# Patient Record
Sex: Female | Born: 1955 | Race: White | Hispanic: No | Smoking: Never smoker
Health system: Southern US, Community
[De-identification: ages and names within clinical notes are randomized; demographics above are authoritative.]

## PROBLEM LIST (undated history)

## (undated) DIAGNOSIS — G43809 Other migraine, not intractable, without status migrainosus: Secondary | ICD-10-CM

## (undated) DIAGNOSIS — H269 Unspecified cataract: Secondary | ICD-10-CM

## (undated) DIAGNOSIS — L409 Psoriasis, unspecified: Secondary | ICD-10-CM

## (undated) HISTORY — DX: Other migraine, not intractable, without status migrainosus: G43.809

## (undated) HISTORY — DX: Unspecified cataract: H26.9

## (undated) HISTORY — DX: Psoriasis, unspecified: L40.9

## (undated) HISTORY — PX: TONSILLECTOMY: SUR1361

---

## 2001-02-22 ENCOUNTER — Ambulatory Visit (HOSPITAL_COMMUNITY): Admission: RE | Admit: 2001-02-22 | Discharge: 2001-02-22 | Payer: Self-pay | Admitting: Family Medicine

## 2001-02-22 ENCOUNTER — Encounter: Payer: Self-pay | Admitting: Family Medicine

## 2002-09-23 ENCOUNTER — Other Ambulatory Visit: Admission: RE | Admit: 2002-09-23 | Discharge: 2002-09-23 | Payer: Self-pay | Admitting: Gynecology

## 2002-10-18 ENCOUNTER — Ambulatory Visit (HOSPITAL_COMMUNITY): Admission: RE | Admit: 2002-10-18 | Discharge: 2002-10-18 | Payer: Self-pay | Admitting: Gynecology

## 2002-10-18 ENCOUNTER — Encounter: Payer: Self-pay | Admitting: Gynecology

## 2005-08-26 ENCOUNTER — Ambulatory Visit (HOSPITAL_COMMUNITY): Admission: RE | Admit: 2005-08-26 | Discharge: 2005-08-26 | Payer: Self-pay | Admitting: Gynecology

## 2005-08-29 ENCOUNTER — Ambulatory Visit: Payer: Self-pay | Admitting: Internal Medicine

## 2005-09-12 ENCOUNTER — Other Ambulatory Visit: Admission: RE | Admit: 2005-09-12 | Discharge: 2005-09-12 | Payer: Self-pay | Admitting: Gynecology

## 2005-12-01 ENCOUNTER — Ambulatory Visit: Payer: Self-pay | Admitting: Internal Medicine

## 2006-05-23 ENCOUNTER — Ambulatory Visit: Payer: Self-pay | Admitting: Internal Medicine

## 2006-05-23 LAB — CONVERTED CEMR LAB
BUN: 12 mg/dL (ref 6–23)
Basophils Absolute: 0 10*3/uL (ref 0.0–0.1)
Calcium: 9.2 mg/dL (ref 8.4–10.5)
Eosinophils Absolute: 0.1 10*3/uL (ref 0.0–0.6)
GFR calc Af Amer: 114 mL/min
GFR calc non Af Amer: 95 mL/min
Hemoglobin: 12.4 g/dL (ref 12.0–15.0)
Iron: 33 ug/dL — ABNORMAL LOW (ref 42–145)
Lymphocytes Relative: 24.4 % (ref 12.0–46.0)
MCHC: 33.3 g/dL (ref 30.0–36.0)
MCV: 73.9 fL — ABNORMAL LOW (ref 78.0–100.0)
Monocytes Absolute: 0.6 10*3/uL (ref 0.2–0.7)
Monocytes Relative: 10.7 % (ref 3.0–11.0)
Neutro Abs: 3.2 10*3/uL (ref 1.4–7.7)
Platelets: 216 10*3/uL (ref 150–400)
Potassium: 4.3 meq/L (ref 3.5–5.1)

## 2006-08-28 ENCOUNTER — Ambulatory Visit (HOSPITAL_COMMUNITY): Admission: RE | Admit: 2006-08-28 | Discharge: 2006-08-28 | Payer: Self-pay | Admitting: Gynecology

## 2006-08-29 ENCOUNTER — Ambulatory Visit: Payer: Self-pay | Admitting: Internal Medicine

## 2006-08-30 ENCOUNTER — Ambulatory Visit: Payer: Self-pay | Admitting: Internal Medicine

## 2006-08-30 LAB — CONVERTED CEMR LAB
AST: 17 units/L (ref 0–37)
Albumin: 3.6 g/dL (ref 3.5–5.2)
Bilirubin, Direct: 0.2 mg/dL (ref 0.0–0.3)
Chloride: 107 meq/L (ref 96–112)
Creatinine, Ser: 0.8 mg/dL (ref 0.4–1.2)
Glucose, Bld: 95 mg/dL (ref 70–99)
Nitrite: NEGATIVE
Potassium: 3.5 meq/L (ref 3.5–5.1)
Sodium: 142 meq/L (ref 135–145)
Specific Gravity, Urine: 1.025 (ref 1.000–1.03)
Total Bilirubin: 1.6 mg/dL — ABNORMAL HIGH (ref 0.3–1.2)
Urine Glucose: NEGATIVE mg/dL
Urobilinogen, UA: 0.2 (ref 0.0–1.0)
pH: 6 (ref 5.0–8.0)

## 2006-09-18 ENCOUNTER — Other Ambulatory Visit: Admission: RE | Admit: 2006-09-18 | Discharge: 2006-09-18 | Payer: Self-pay | Admitting: Gynecology

## 2006-11-22 ENCOUNTER — Ambulatory Visit: Payer: Self-pay | Admitting: Internal Medicine

## 2006-11-22 DIAGNOSIS — G43809 Other migraine, not intractable, without status migrainosus: Secondary | ICD-10-CM | POA: Insufficient documentation

## 2006-11-23 ENCOUNTER — Encounter: Payer: Self-pay | Admitting: Internal Medicine

## 2006-11-23 DIAGNOSIS — F32A Depression, unspecified: Secondary | ICD-10-CM | POA: Insufficient documentation

## 2006-11-23 DIAGNOSIS — F329 Major depressive disorder, single episode, unspecified: Secondary | ICD-10-CM

## 2006-11-23 DIAGNOSIS — K219 Gastro-esophageal reflux disease without esophagitis: Secondary | ICD-10-CM | POA: Insufficient documentation

## 2006-11-23 DIAGNOSIS — M1990 Unspecified osteoarthritis, unspecified site: Secondary | ICD-10-CM | POA: Insufficient documentation

## 2007-07-04 ENCOUNTER — Ambulatory Visit: Payer: Self-pay | Admitting: Internal Medicine

## 2007-07-04 DIAGNOSIS — B029 Zoster without complications: Secondary | ICD-10-CM | POA: Insufficient documentation

## 2007-07-04 DIAGNOSIS — M279 Disease of jaws, unspecified: Secondary | ICD-10-CM | POA: Insufficient documentation

## 2007-07-20 ENCOUNTER — Encounter: Payer: Self-pay | Admitting: *Deleted

## 2007-07-20 DIAGNOSIS — Z9089 Acquired absence of other organs: Secondary | ICD-10-CM | POA: Insufficient documentation

## 2007-09-03 ENCOUNTER — Ambulatory Visit (HOSPITAL_COMMUNITY): Admission: RE | Admit: 2007-09-03 | Discharge: 2007-09-03 | Payer: Self-pay | Admitting: Gynecology

## 2007-09-24 ENCOUNTER — Other Ambulatory Visit: Admission: RE | Admit: 2007-09-24 | Discharge: 2007-09-24 | Payer: Self-pay | Admitting: Gynecology

## 2007-12-25 ENCOUNTER — Ambulatory Visit: Payer: Self-pay | Admitting: Internal Medicine

## 2007-12-25 LAB — CONVERTED CEMR LAB
Alkaline Phosphatase: 43 units/L (ref 39–117)
Bilirubin, Direct: 0.1 mg/dL (ref 0.0–0.3)
Calcium: 9.1 mg/dL (ref 8.4–10.5)
GFR calc Af Amer: 114 mL/min
GFR calc non Af Amer: 94 mL/min
Potassium: 4.1 meq/L (ref 3.5–5.1)
Sodium: 141 meq/L (ref 135–145)
Total Bilirubin: 1.1 mg/dL (ref 0.3–1.2)

## 2008-02-13 ENCOUNTER — Ambulatory Visit: Payer: Self-pay | Admitting: Internal Medicine

## 2008-02-14 ENCOUNTER — Encounter: Payer: Self-pay | Admitting: Internal Medicine

## 2008-02-15 DIAGNOSIS — L409 Psoriasis, unspecified: Secondary | ICD-10-CM

## 2008-02-15 DIAGNOSIS — H269 Unspecified cataract: Secondary | ICD-10-CM

## 2008-02-15 HISTORY — DX: Psoriasis, unspecified: L40.9

## 2008-02-15 HISTORY — DX: Unspecified cataract: H26.9

## 2008-02-18 LAB — CONVERTED CEMR LAB: TSH: 5.44 u[IU]/mL

## 2008-03-19 ENCOUNTER — Ambulatory Visit: Payer: Self-pay | Admitting: Internal Medicine

## 2008-03-24 LAB — CONVERTED CEMR LAB: TSH: 4.68 microintl units/mL (ref 0.35–5.50)

## 2008-09-08 ENCOUNTER — Ambulatory Visit (HOSPITAL_COMMUNITY): Admission: RE | Admit: 2008-09-08 | Discharge: 2008-09-08 | Payer: Self-pay | Admitting: Gynecology

## 2008-09-24 ENCOUNTER — Other Ambulatory Visit: Admission: RE | Admit: 2008-09-24 | Discharge: 2008-09-24 | Payer: Self-pay | Admitting: Gynecology

## 2008-09-24 ENCOUNTER — Ambulatory Visit: Payer: Self-pay | Admitting: Women's Health

## 2008-09-24 ENCOUNTER — Encounter: Payer: Self-pay | Admitting: Women's Health

## 2008-10-15 ENCOUNTER — Ambulatory Visit: Payer: Self-pay | Admitting: Internal Medicine

## 2009-03-11 ENCOUNTER — Ambulatory Visit: Payer: Self-pay | Admitting: Internal Medicine

## 2009-03-11 DIAGNOSIS — M542 Cervicalgia: Secondary | ICD-10-CM | POA: Insufficient documentation

## 2009-03-11 DIAGNOSIS — R519 Headache, unspecified: Secondary | ICD-10-CM | POA: Insufficient documentation

## 2009-03-11 DIAGNOSIS — R51 Headache: Secondary | ICD-10-CM

## 2009-03-12 ENCOUNTER — Telehealth: Payer: Self-pay | Admitting: Internal Medicine

## 2009-03-12 LAB — CONVERTED CEMR LAB
Basophils Absolute: 0.1 10*3/uL (ref 0.0–0.1)
Calcium: 9.2 mg/dL (ref 8.4–10.5)
Eosinophils Relative: 1.2 % (ref 0.0–5.0)
GFR calc non Af Amer: 93.35 mL/min (ref >60)
Glucose, Bld: 94 mg/dL (ref 70–99)
Hemoglobin: 13.5 g/dL (ref 12.0–15.0)
Lymphocytes Relative: 25.9 % (ref 12.0–46.0)
Monocytes Relative: 9.5 % (ref 3.0–12.0)
Neutro Abs: 3.6 10*3/uL (ref 1.4–7.7)
Potassium: 4.1 meq/L (ref 3.5–5.1)
RDW: 14 % (ref 11.5–14.6)
Sodium: 140 meq/L (ref 135–145)
TSH: 3.4 microintl units/mL (ref 0.35–5.50)
Vitamin B-12: 513 pg/mL (ref 211–911)
WBC: 5.9 10*3/uL (ref 4.5–10.5)

## 2009-03-13 ENCOUNTER — Encounter: Payer: Self-pay | Admitting: Internal Medicine

## 2009-03-13 LAB — CONVERTED CEMR LAB: Vit D, 25-Hydroxy: 24 ng/mL — ABNORMAL LOW (ref 30–89)

## 2009-03-18 ENCOUNTER — Encounter: Payer: Self-pay | Admitting: Internal Medicine

## 2009-07-14 ENCOUNTER — Telehealth: Payer: Self-pay | Admitting: Internal Medicine

## 2009-09-25 ENCOUNTER — Other Ambulatory Visit: Admission: RE | Admit: 2009-09-25 | Discharge: 2009-09-25 | Payer: Self-pay | Admitting: Gynecology

## 2009-09-25 ENCOUNTER — Ambulatory Visit: Payer: Self-pay | Admitting: Women's Health

## 2009-09-25 ENCOUNTER — Ambulatory Visit (HOSPITAL_COMMUNITY): Admission: RE | Admit: 2009-09-25 | Discharge: 2009-09-25 | Payer: Self-pay | Admitting: Gynecology

## 2010-02-10 ENCOUNTER — Ambulatory Visit
Admission: RE | Admit: 2010-02-10 | Discharge: 2010-02-10 | Payer: Self-pay | Source: Home / Self Care | Attending: Internal Medicine | Admitting: Internal Medicine

## 2010-02-10 DIAGNOSIS — M2550 Pain in unspecified joint: Secondary | ICD-10-CM | POA: Insufficient documentation

## 2010-03-07 ENCOUNTER — Encounter: Payer: Self-pay | Admitting: Gynecology

## 2010-03-12 ENCOUNTER — Telehealth: Payer: Self-pay | Admitting: Internal Medicine

## 2010-03-12 ENCOUNTER — Encounter: Payer: Self-pay | Admitting: Internal Medicine

## 2010-03-12 ENCOUNTER — Ambulatory Visit
Admission: RE | Admit: 2010-03-12 | Discharge: 2010-03-12 | Payer: Self-pay | Source: Home / Self Care | Attending: Internal Medicine | Admitting: Internal Medicine

## 2010-03-12 DIAGNOSIS — R7611 Nonspecific reaction to tuberculin skin test without active tuberculosis: Secondary | ICD-10-CM | POA: Insufficient documentation

## 2010-03-16 NOTE — Progress Notes (Signed)
Summary: REFILL - FLEXERIL  Phone Note Refill Request Message from:  Pharmacy  Refills Requested: Medication #1:  CYCLOBENZAPRINE HCL 10 MG TABS 1/2-1 tab by mouth two times a day as needed muscle spasms Initial call taken by: Lamar Sprinkles, CMA,  Jul 14, 2009 8:08 AM  Follow-up for Phone Call        ok 3 ref Follow-up by: Tresa Garter MD,  Jul 14, 2009 2:20 PM    Prescriptions: CYCLOBENZAPRINE HCL 10 MG TABS (CYCLOBENZAPRINE HCL) 1/2-1 tab by mouth two times a day as needed muscle spasms  #60 x 3   Entered by:   Lamar Sprinkles, CMA   Authorized by:   Tresa Garter MD   Signed by:   Lamar Sprinkles, CMA on 07/14/2009   Method used:   [facility]        [facility] [phone]* (retail)       [address]       [place], [place]  [address]       Ph: [phone]       Fax: [phone]   RxID:   [id number]

## 2010-03-16 NOTE — Assessment & Plan Note (Signed)
Summary: headaches,nausea.offered sda chose 1/26/cd   Vital Signs:  Patient profile:   55 year old female Weight:      155 pounds Temp:     98.1 degrees F oral Pulse rate:   76 / minute BP sitting:   120 / 84  (left arm)  Vitals Entered By: Tora Perches (March 11, 2009 3:05 PM) CC: h/a and nsusea Is Patient Diabetic? No   CC:  h/a and nsusea.  History of Present Illness: C/o HA and neck pain daily. Working 7 d/wk  Preventive Screening-Counseling & Management  Alcohol-Tobacco     Smoking Status: never  Current Medications (verified): 1)  Fluoxetine Hcl 20 Mg  Tabs (Fluoxetine Hcl) .... Qd 2)  Imitrex 100 Mg Tabs (Sumatriptan Succinate) .... Once Daily Prn 3)  Vitamin D3 1000 Unit  Tabs (Cholecalciferol) .Marland Kitchen.. 1 By Mouth Daily 4)  Synthroid 25 Mcg Tabs (Levothyroxine Sodium) .Marland Kitchen.. 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    Physical Exam  General:  Well-developed,well-nourished,in no acute distress; alert,appropriate and cooperative throughout examination Eyes:  No corneal or conjunctival inflammation noted. EOMI. Perrla. Ears:  External ear exam shows no significant lesions or deformities.  Otoscopic examination reveals clear canals, tympanic membranes are intact bilaterally without bulging, retraction, inflammation or discharge. Hearing is grossly normal bilaterally. Nose:  External nasal examination shows no deformity or inflammation. Nasal mucosa are pink and moist without lesions or  exudates. Mouth:  Oral mucosa and oropharynx without lesions or exudates.  Teeth in good repair. Neck:  Tender w/ROM and over nuchal line Lungs:  Normal respiratory effort, chest expands symmetrically. Lungs are clear to auscultation, no crackles or wheezes. Heart:  Normal rate and regular rhythm. S1 and S2 normal without gallop, murmur, click, rub or other extra sounds. Abdomen:  Bowel sounds positive,abdomen soft and non-tender without masses, organomegaly or hernias noted. Msk:  No deformity or scoliosis noted of thoracic or lumbar spine.   Neurologic:  No cranial nerve deficits noted. Station and gait are normal. Plantar reflexes are down-going bilaterally. DTRs are symmetrical throughout. Sensory, motor and coordinative functions appear intact. Skin:  Intact without suspicious lesions or rashes Psych:  Oriented X3, normally interactive, and not depressed appearing.     Impression & Recommendations:  Problem # 1:  NECK PAIN (ICD-723.1) MSK Assessment New  The following medications were removed from the medication list:    Meloxicam 15 Mg Tabs (Meloxicam) ..... One by mouth daily pc prn Her updated medication list for this problem includes:    Tramadol Hcl 50 Mg Tabs (Tramadol hcl) .Marland Kitchen... 1-2 tabs by mouth two times a day as needed pain    Cyclobenzaprine Hcl 10 Mg Tabs (Cyclobenzaprine hcl) .Marland Kitchen... 1/2-1 tab by mouth two times a day as needed muscle spasms    Celebrex 200 Mg Caps (Celecoxib) .Marland Kitchen... 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 See "Patient Instructions".  The following medications were removed from the medication list:    Meloxicam 15 Mg Tabs (Meloxicam)  ..... One by mouth daily pc prn Her updated medication list for this problem includes:    Imitrex 100 Mg Tabs (Sumatriptan succinate) ..... Once daily prn    Tramadol Hcl 50 Mg Tabs (Tramadol hcl) .Marland Kitchen... 1-2 tabs by mouth two times a day as needed pain    Celebrex 200 Mg Caps (Celecoxib) .Marland Kitchen... 1 by mouth bid  Orders: TLB-B12, Serum-Total ONLY (98119-J47) TLB-BMP (Basic Metabolic Panel-BMET) (80048-METABOL) TLB-CBC Platelet - w/Differential (85025-CBCD) TLB-TSH (Thyroid Stimulating Hormone) (84443-TSH) T-Vitamin D (25-Hydroxy) (82956-21308)  Problem # 3:  MIGRAINE VARIANTS, W/O INTRACTABLE MIGRAINE (ICD-346.20) Assessment: Improved  The following medications were removed from the medication list:    Meloxicam 15 Mg Tabs (Meloxicam) ..... One by mouth daily pc prn Her updated medication list for this problem includes:    Imitrex 100 Mg Tabs (Sumatriptan succinate) ..... Once daily prn    Tramadol Hcl 50 Mg Tabs (Tramadol hcl) .Marland Kitchen... 1-2 tabs by mouth two times a day as needed pain    Celebrex 200 Mg Caps (Celecoxib) .Marland Kitchen... 1 by mouth bid  Problem # 4:  DEPRESSION (ICD-311) Assessment: Improved  Her updated medication list for this problem includes:    Fluoxetine Hcl 20 Mg Tabs (Fluoxetine hcl) ..... Qd  Orders: TLB-B12, Serum-Total ONLY (65784-O96) TLB-BMP (Basic Metabolic Panel-BMET) (80048-METABOL) TLB-CBC Platelet - w/Differential (85025-CBCD) TLB-TSH (Thyroid Stimulating Hormone) (84443-TSH) T-Vitamin D (25-Hydroxy) (29528-41324)  Complete Medication List: 1)  Fluoxetine Hcl 20 Mg Tabs (Fluoxetine hcl) .... Qd 2)  Imitrex 100 Mg Tabs (Sumatriptan succinate) .... Once daily prn 3)  Vitamin D3 1000 Unit Tabs (Cholecalciferol) .Marland Kitchen.. 1 by mouth daily 4)  Synthroid 25 Mcg Tabs (Levothyroxine sodium) .Marland Kitchen.. 1 by mouth qd 5)  Tramadol Hcl 50 Mg Tabs (Tramadol hcl) .Marland Kitchen.. 1-2 tabs by mouth two times a day as needed pain 6)  Cyclobenzaprine Hcl 10 Mg Tabs (Cyclobenzaprine hcl) .... 1/2-1  tab by mouth two times a day as needed muscle spasms 7)  Celebrex 200 Mg Caps (Celecoxib) .Marland Kitchen.. 1 by mouth bid  Patient Instructions: 1)  Start taking a yoga class  2)  Use stretching exercises that I have provided (15 min. or longer every day) 3)  Heat 4)  Contour pillow 5)  Please schedule a follow-up appointment in 3 months. Prescriptions: CELEBREX 200 MG CAPS (CELECOXIB) 1 by mouth bid  #60 x 6   Entered and Authorized by:   Tresa Garter MD   Signed by:   Tresa Garter MD on 03/11/2009   Method used:   Print then Give to Patient   RxID:   [id number] SYNTHROID 25 MCG TABS (LEVOTHYROXINE SODIUM) 1 by mouth qd  #90 x 3   Entered and Authorized by:   Tresa Garter MD   Signed by:   Tresa Garter MD on 03/11/2009   Method used:   [facility]ron[facility] to        [facility] [phone]* (retail)       [address]       [place], [place]  [address]       Ph: [phone]       Fax: [phone]   RxID:   [id number] IMITREX 100 MG TABS (SUMATRIPTAN SUCCINATE) once daily prn  #12 x 6   Entered and Authorized by:   Tresa Garter MD   Signed by:  Tresa Garter MD on 03/11/2009   Method used:   Electronically to        [facility] [phone]* (retail)       [address]       [place], [place]  [address]       Ph: [phone]       Fax: [phone]   RxID:   [id number] FLUOXETINE HCL 20 MG  TABS (FLUOXETINE HCL) qd  #90 x 3   Entered and Authorized by:   Tresa Garter MD   Signed by:   Tresa Garter MD on 03/11/2009   Method used:   Electronically to        [facility] [phone]* (retail)       [address]       [place], [place]  [address]       Ph: [phone]       Fax: [phone]   RxID:   [id number] CYCLOBENZAPRINE HCL 10 MG TABS (CYCLOBENZAPRINE HCL) 1/2-1 tab by mouth two times a day as needed muscle spasms  #60 x 3   Entered  and Authorized by:   Tresa Garter MD   Signed by:   Tresa Garter MD on 03/11/2009   Method used:   Electronically to        [facility] [phone]* (retail)       [address]       [place], [place]  [address]       Ph: [phone]       Fax: [phone]   RxID:   [id number] TRAMADOL HCL 50 MG TABS (TRAMADOL HCL) 1-2 tabs by mouth two times a day as needed pain  #120 x 3   Entered and Authorized by:   Tresa Garter MD   Signed by:   Tresa Garter MD on 03/11/2009   Method used:   Electronically to        [facility] [phone]* (retail)       [address]       [place], [place]  [address]       Ph: [phone]       Fax: [phone]   RxID:   [id number]

## 2010-03-16 NOTE — Progress Notes (Signed)
Summary: Celebrex PA  Phone Note From Pharmacy   Caller: [facility] [phone] Call For: ID:  [id number]  Summary of Call: PA request--Celebrex.  Called ins and provided dx, and current meds. (not on prednisone/warfarin). Waiting for fax reply. Initial call taken by: Lucious Groves,  March 12, 2009 1:16 PM  Follow-up for Phone Call        Intol of meloxicam, naproxyn did not help Please PA Follow-up by: Tresa Garter MD,  March 12, 2009 11:12 PM  Additional Follow-up for Phone Call Additional follow up Details #1::        PA was denied by insurance. Covered meds are CLinoril, disalcid, dolobid, feldene, lodine/lodine xl, naprosyn, motrin, relafen & voltaren. Are any of these an option. Additional Follow-up by: Lamar Sprinkles, CMA,  March 13, 2009 3:52 PM    Additional Follow-up for Phone Call Additional follow up Details #2::    No - she had abd pain w/most of them. Thx Follow-up by: Tresa Garter MD,  March 13, 2009 4:58 PM  Additional Follow-up for Phone Call Additional follow up Details #3:: Details for Additional Follow-up Action Taken: We may need to do an appeal? .................................Marland KitchenLamar Sprinkles, CMA  March 13, 2009 6:14 PM   If you would like to do an appeal, I will need a dictated/signed letter medical necessity. Please advise.Lucious Groves,  March 17, 2009 2:42 PM  note was faxed to patient insurance. Will wait for reply. Lucious Groves  March 18, 2009 3:14 PM   Approved until 2021. *sent copy to scanning. Lucious Groves  March 18, 2009 5:05 PM

## 2010-03-16 NOTE — Miscellaneous (Signed)
Summary: vit d  Clinical Lists Changes  Medications: Changed medication from VITAMIN D3 1000 UNIT  TABS (CHOLECALCIFEROL) 1 by mouth daily to VITAMIN D3 1000 UNIT  TABS (CHOLECALCIFEROL) 2 by mouth daily

## 2010-03-16 NOTE — Letter (Signed)
Summary: Generic Letter  [facility]  [address] [place], [place] [address]   Phone: [phone]  Fax: [phone]    03/18/2009  RE: VERNONA PEAKE [address] [place], [place]  [address]  Ms. Damian as tried CLinoril, disalcid, dolobid, feldene, lodine/lodine xl, naprosyn, motrin, relafen & voltaren in the past and was not able to tolerated them due to upset stomach. Please approve Celebrex.          Sincerely,   Jacinta Shoe MD  Appended Document: Generic Letter Letter was faxed to insurance company at # [phone].

## 2010-03-16 NOTE — Medication Information (Signed)
Summary: Celebrex Approved/[facility]  Celebrex Approved/[facility]   Imported By: Sherian Rein 03/20/2009 10:55:57  _____________________________________________________________________  External Attachment:    Type:   Image     Comment:   External Document

## 2010-03-18 NOTE — Assessment & Plan Note (Signed)
Summary: headaches/cd   Vital Signs:  Patient profile:   55 year old female Height:      68 inches Weight:      155 pounds BMI:     23.65 Temp:     98.6 degrees F oral Pulse rate:   78 / minute Pulse rhythm:   regular Resp:     16 per minute BP sitting:   136 / 88  (left arm) Cuff size:   regular  Vitals Entered By: Lanier Prude, CMA(AAMA) (February 10, 2010 8:12 AM) CC: Migraine HA and vision disturbances X 2 days Is Patient Diabetic? No Comments pt needs Rf on Fluoxetine, Imitrex and Synthroid   CC:  Migraine HA and vision disturbances X 2 days.  History of Present Illness: C/o migraines and c/o neck pain and pains all over 5/10  almost every day in pms mostly x long time C/o pains everywhere  - Advil helps C/o migraines q 4-5 wks  Current Medications (verified): 1)  Fluoxetine Hcl 20 Mg  Tabs (Fluoxetine Hcl) .... Qd 2)  Imitrex 100 Mg Tabs (Sumatriptan Succinate) .... Once Daily Prn 3)  Vitamin D3 1000 Unit  Tabs (Cholecalciferol) .... 2 By Mouth Daily 4)  Synthroid 25 Mcg Tabs (Levothyroxine Sodium) .Marland Kitchen.. 1 By Mouth Qd 5)  Tramadol Hcl 50 Mg Tabs (Tramadol Hcl) .Marland Kitchen.. 1-2 Tabs By Mouth Two Times A Day As Needed Pain 6)  Cyclobenzaprine Hcl 10 Mg Tabs (Cyclobenzaprine Hcl) .... 1/2-1 Tab By Mouth Two Times A Day As Needed Muscle Spasms 7)  Celebrex 200 Mg Caps (Celecoxib) .Marland Kitchen.. 1 By Mouth Bid  Allergies (verified): 1)  Mobic (Meloxicam)  Past History:  Past Medical History: JAW PAIN (ICD-526.9) HERPES ZOSTER (ICD-053.9) OSTEOARTHRITIS (ICD-715.90) GERD (ICD-530.81) DEPRESSION (ICD-311) MIGRAINE VARIANTS, W/O INTRACTABLE MIGRAINE (ICD-346.20)  Cataract 2010 Dr Nile Riggs Psoriasis 2010   Social History: Occupation: Designer, industrial/product at [employer] She is from [place] Single, 1 son Never Smoked  Physical Exam  General:  Well-developed,well-nourished,in no acute distress; alert,appropriate and cooperative throughout examination Nose:  External nasal examination shows no  deformity or inflammation. Nasal mucosa are pink and moist without lesions or exudates. Mouth:  Oral mucosa and oropharynx without lesions or exudates.  Teeth in good repair. Neck:  Tender w/ROM and over nuchal line Lungs:  Normal respiratory effort, chest expands symmetrically. Lungs are clear to auscultation, no crackles or wheezes. Heart:  Normal rate and regular rhythm. S1 and S2 normal without gallop, murmur, click, rub or other extra sounds. Abdomen:  Bowel sounds positive,abdomen soft and non-tender without masses, organomegaly or hernias noted. Msk:  No deformity or scoliosis noted of thoracic or lumbar spine.  normal ROM, no joint tenderness, no joint swelling, no joint warmth, no redness over joints, no joint deformities, no joint instability, and no crepitation.   Pulses:  R and L carotid,radial,femoral,dorsalis pedis and posterior tibial pulses are full and equal bilaterally Extremities:  No clubbing, cyanosis, edema, or deformity noted with normal full range of motion of all joints.   Neurologic:  No cranial nerve deficits noted. Station and gait are normal. Plantar reflexes are down-going bilaterally. DTRs are symmetrical throughout. Sensory, motor and coordinative functions appear intact. Skin:  Intact without suspicious lesions or rashes Psych:  Oriented X3, normally interactive, and not depressed appearing.     Impression & Recommendations:  Problem # 1:  HEADACHE (ICD-784.0) Assessment Unchanged  The following medications were removed from the medication list:    Celebrex 200 Mg Caps (Celecoxib) .Marland Kitchen... 1 by  mouth bid Her updated medication list for this problem includes:    Imitrex 100 Mg Tabs (Sumatriptan succinate) ..... Once daily prn    Tramadol Hcl 50 Mg Tabs (Tramadol hcl) .Marland Kitchen... 1-2 tabs by mouth two times a day as needed pain  Problem # 2:  NECK PAIN (ICD-723.1) MSK Assessment: Unchanged  The following medications were removed from the medication list:     Cyclobenzaprine Hcl 10 Mg Tabs (Cyclobenzaprine hcl) .Marland Kitchen... 1/2-1 tab by mouth two times a day as needed muscle spasms    Celebrex 200 Mg Caps (Celecoxib) .Marland Kitchen... 1 by mouth bid Her updated medication list for this problem includes:    Tramadol Hcl 50 Mg Tabs (Tramadol hcl) .Marland Kitchen... 1-2 tabs by mouth two times a day as needed pain  Problem # 3:  ARTHRALGIA (ICD-719.40) Assessment: Unchanged Declied PT, Rhem cons and labs at this point. Poss post - viral arthritis, possible FMS; symptoms are aggravated by her job  Problem # 4:  DEPRESSION (ICD-311) Assessment: Improved  Her updated medication list for this problem includes:    Fluoxetine Hcl 20 Mg Tabs (Fluoxetine hcl) ..... Qd  Complete Medication List: 1)  Fluoxetine Hcl 20 Mg Tabs (Fluoxetine hcl) .... Qd 2)  Imitrex 100 Mg Tabs (Sumatriptan succinate) .... Once daily prn 3)  Vitamin D3 1000 Unit Tabs (Cholecalciferol) .... 2 by mouth daily 4)  Synthroid 25 Mcg Tabs (Levothyroxine sodium) .Marland Kitchen.. 1 by mouth qd 5)  Tramadol Hcl 50 Mg Tabs (Tramadol hcl) .Marland Kitchen.. 1-2 tabs by mouth two times a day as needed pain 6)  Savella 50 Mg Tabs (Milnacipran hcl) .Marland Kitchen.. 1 by mouth two times a day for fibromyalgia 7)  Clobetasol Propionate 0.05 % Foam (Clobetasol propionate) .... Use two times a day prn  Patient Instructions: 1)  Please schedule a follow-up appointment in 6 months well w/labs and Vit B12 Vit D and ESR, RF [phone]. Prescriptions: CLOBETASOL PROPIONATE 0.05 % FOAM (CLOBETASOL PROPIONATE) use two times a day prn  #100 g x 3   Entered and Authorized by:   Tresa Garter MD   Signed by:   Tresa Garter MD on 02/10/2010   Method used:   Print then Give to Patient   RxID:   [id number] TRAMADOL HCL 50 MG TABS (TRAMADOL HCL) 1-2 tabs by mouth two times a day as needed pain  #120 Tablet x 1   Entered and Authorized by:   Tresa Garter MD   Signed by:   Tresa Garter MD on 02/10/2010   Method used:   Print then Give to  Patient   RxID:   [id number] SYNTHROID 25 MCG TABS (LEVOTHYROXINE SODIUM) 1 by mouth qd  #90 Tablet x 1   Entered and Authorized by:   Tresa Garter MD   Signed by:   Tresa Garter MD on 02/10/2010   Method used:   Print then Give to Patient   RxID:   [id number] IMITREX 100 MG TABS (SUMATRIPTAN SUCCINATE) once daily prn  #12 x 6   Entered and Authorized by:   Tresa Garter MD   Signed by:   Tresa Garter MD on 02/10/2010   Method used:   Print then Give to Patient   RxID:   [id number] FLUOXETINE HCL 20 MG  TABS (FLUOXETINE HCL) qd  #90 x 3   Entered and Authorized by:   Tresa Garter MD   Signed by:   Tresa Garter MD on 02/10/2010  Method used:   Print then Give to Patient   RxID:   [id number] SAVELLA 50 MG TABS (MILNACIPRAN HCL) 1 by mouth two times a day for fibromyalgia  #60 x 6   Entered and Authorized by:   Tresa Garter MD   Signed by:   Tresa Garter MD on 02/10/2010   Method used:   Print then Give to Patient   RxID:   [id number]    Orders Added: 1)  Est. Patient Level IV [[address]]

## 2010-03-24 NOTE — Letter (Signed)
Summary: Physical Statement/[facility]  Physical Statement/[facility]   Imported By: Sherian Rein 03/17/2010 14:15:52  _____________________________________________________________________  External Attachment:    Type:   Image     Comment:   External Document

## 2010-03-24 NOTE — Progress Notes (Signed)
Summary: WAITING ON CXR RESULTS TO COMPLETE FORM  Phone Note Call from Patient   Summary of Call: Pt dropped off form needed for employment at [employer]. Has had postive PPD in 2000 and following cxr's have been normal. Pt comming in today for cxr.  Initial call taken by: Lamar Sprinkles, CMA,  March 12, 2010 9:09 AM  Follow-up for Phone Call        CXR normal, ok to provide pt a copy? Follow-up by: Lamar Sprinkles, CMA,  March 12, 2010 2:26 PM  Additional Follow-up for Phone Call Additional follow up Details #1::        OK Thank you!  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

## 2010-05-06 ENCOUNTER — Telehealth: Payer: Self-pay | Admitting: *Deleted

## 2010-05-06 MED ORDER — MILNACIPRAN HCL 50 MG PO TABS: 1.0000 | ORAL_TABLET | Freq: Two times a day (BID) | ORAL | Status: DC

## 2010-05-06 NOTE — Telephone Encounter (Signed)
rec fax stating pt's Jamie Roberts must be filled for 90 days. Will send new rx.

## 2010-07-08 ENCOUNTER — Encounter: Payer: Self-pay | Admitting: Internal Medicine

## 2010-07-08 ENCOUNTER — Ambulatory Visit (INDEPENDENT_AMBULATORY_CARE_PROVIDER_SITE_OTHER): Payer: BC Managed Care – PPO | Admitting: Internal Medicine

## 2010-07-08 VITALS — BP 130/90 | HR 93 | Temp 97.9°F | Ht 68.0 in | Wt 150.1 lb

## 2010-07-08 DIAGNOSIS — R112 Nausea with vomiting, unspecified: Secondary | ICD-10-CM

## 2010-07-08 DIAGNOSIS — G43919 Migraine, unspecified, intractable, without status migrainosus: Secondary | ICD-10-CM

## 2010-07-08 MED ORDER — PROMETHAZINE HCL 25 MG PO TABS: 25.0000 mg | ORAL_TABLET | Freq: Four times a day (QID) | ORAL | Status: DC | PRN

## 2010-07-08 MED ORDER — PROMETHAZINE HCL 25 MG/ML IJ SOLN
25.0000 mg | Freq: Four times a day (QID) | INTRAMUSCULAR | Status: DC | PRN
  Administered 2010-07-08: 25 mg via INTRAMUSCULAR

## 2010-07-08 MED ORDER — MEPERIDINE HCL 50 MG/ML IJ SOLN
50.0000 mg | Freq: Once | INTRAMUSCULAR | Status: AC
  Administered 2010-07-08: 50 mg via INTRAMUSCULAR

## 2010-07-08 MED ORDER — SUMATRIPTAN SUCCINATE 100 MG PO TABS: 100.0000 mg | ORAL_TABLET | Freq: Every day | ORAL | Status: DC | PRN

## 2010-07-08 NOTE — Progress Notes (Signed)
  Subjective:    Patient ID: Jamie Roberts, female    DOB: [birth year], 55 y.o.   MRN: [id number]  HPI  Here with migraine headache symptoms  Long hx recurrent migraines - current symptoms precipitated by running out of Imitrex and ++stress associated with nausea and vomiting as well as light sensitivity No weakness, no vision loss No recent trauma or falls Severe migraines (like now) occur 1-2x/year symptoms not improved with OTC medications/analgesics  Past Medical History  Diagnosis Date  . Jaw pain   . Herpes zoster   . Variants of migraine without mention of intractable migraine   . Cataracts, bilateral 2010  . Psoriasis 2010  . Osteoarthritis   . GERD (gastroesophageal reflux disease)     Review of Systems  Constitutional: Negative for fever.  Eyes: Positive for photophobia. Negative for pain.  Respiratory: Negative for shortness of breath.   Cardiovascular: Negative for chest pain and palpitations.  Neurological: Negative for syncope.       Objective:   Physical Exam BP 130/90  Pulse 93  Temp(Src) 97.9 F (36.6 C) (Oral)  Ht 5\' 8"  (1.727 m)  Wt 150 lb 1.9 oz (68.094 kg)  BMI 22.83 kg/m2  SpO2 99% Physical Exam  Constitutional: She is oriented to person, place, and time. She appears well-developed and well-nourished. Mod discomfort.  HENT: Head: Normocephalic and atraumatic. Ears; B TMs ok, no erythema or effusion; Nose: Nose normal.  Mouth/Throat: Oropharynx is clear and moist. No oropharyngeal exudate.  Eyes: Conjunctivae and EOM are normal. Pupils are equal, round, and reactive to light. No scleral icterus.  Neck: Supple, Normal range of motion. Neck supple. No JVD present. No thyromegaly present.  Cardiovascular: Normal rate, regular rhythm and normal heart sounds.  No murmur heard. No BLE edema. Pulmonary/Chest: Effort normal and breath sounds normal. No respiratory distress. She has no wheezes. Neurological: She is alert and oriented to person, place, and  time. No cranial nerve deficit. Coordination normal.  Skin: Skin is warm and dry. No rash noted. No erythema.     Lab Results  Component Value Date   WBC 5.9 03/11/2009   HGB 13.5 03/11/2009   HCT 41.2 03/11/2009   PLT 228.0 03/11/2009   ALT 13 12/25/2007   AST 15 12/25/2007   NA 140 03/11/2009   K 4.1 03/11/2009   CL 104 03/11/2009   CREATININE 0.7 03/11/2009   BUN 10 03/11/2009   CO2 28 03/11/2009   TSH 3.40 03/11/2009   HGBA1C 5.6 05/23/2006     Assessment & Plan:  Intractable migraine with nausea and vomiting - hx same with severe symptoms 1-2x/year - no change in usual pattern - no neuro deficits on exam - IM Demerol 50/Phenergan 25 x 1 today and refill Imitrex + phenergan rx (out of both) - if continued frequent flares, consider prophlatic meds - to follow up PCP ([facility]) on same as needed

## 2010-07-08 NOTE — Patient Instructions (Signed)
It was good to see you today. New Imitrex and Phenergan pills - Your prescription(s) have been submitted to your pharmacy. Please take as directed and contact our office if you believe you are having problem(s) with the medication(s). Shot of Demerol + Phenergan given today for pain and nausea and vomiting  - do not drive If continued frequent migraines, talk with Dr. Posey Rea about other possible medications to help prevent severe headache spells

## 2010-08-02 ENCOUNTER — Other Ambulatory Visit: Payer: Self-pay

## 2010-08-02 ENCOUNTER — Other Ambulatory Visit: Payer: Self-pay | Admitting: Internal Medicine

## 2010-08-02 DIAGNOSIS — T887XXA Unspecified adverse effect of drug or medicament, initial encounter: Secondary | ICD-10-CM

## 2010-08-02 DIAGNOSIS — M79609 Pain in unspecified limb: Secondary | ICD-10-CM

## 2010-08-02 DIAGNOSIS — Z0000 Encounter for general adult medical examination without abnormal findings: Secondary | ICD-10-CM

## 2010-08-09 ENCOUNTER — Encounter: Payer: Self-pay | Admitting: Internal Medicine

## 2010-08-10 ENCOUNTER — Encounter: Payer: Self-pay | Admitting: Internal Medicine

## 2010-08-10 ENCOUNTER — Other Ambulatory Visit (INDEPENDENT_AMBULATORY_CARE_PROVIDER_SITE_OTHER): Payer: BC Managed Care – PPO

## 2010-08-10 ENCOUNTER — Ambulatory Visit (INDEPENDENT_AMBULATORY_CARE_PROVIDER_SITE_OTHER): Payer: BC Managed Care – PPO | Admitting: Internal Medicine

## 2010-08-10 VITALS — BP 132/88 | HR 84 | Temp 99.9°F | Resp 16 | Ht 67.0 in | Wt 147.0 lb

## 2010-08-10 DIAGNOSIS — Z0000 Encounter for general adult medical examination without abnormal findings: Secondary | ICD-10-CM | POA: Insufficient documentation

## 2010-08-10 DIAGNOSIS — R51 Headache: Secondary | ICD-10-CM

## 2010-08-10 DIAGNOSIS — M79609 Pain in unspecified limb: Secondary | ICD-10-CM

## 2010-08-10 DIAGNOSIS — G43919 Migraine, unspecified, intractable, without status migrainosus: Secondary | ICD-10-CM

## 2010-08-10 DIAGNOSIS — M797 Fibromyalgia: Secondary | ICD-10-CM

## 2010-08-10 DIAGNOSIS — IMO0001 Reserved for inherently not codable concepts without codable children: Secondary | ICD-10-CM

## 2010-08-10 DIAGNOSIS — T887XXA Unspecified adverse effect of drug or medicament, initial encounter: Secondary | ICD-10-CM

## 2010-08-10 DIAGNOSIS — F329 Major depressive disorder, single episode, unspecified: Secondary | ICD-10-CM

## 2010-08-10 LAB — CBC WITH DIFFERENTIAL/PLATELET
Basophils Absolute: 0 10*3/uL (ref 0.0–0.1)
Lymphocytes Relative: 18.7 % (ref 12.0–46.0)
Lymphs Abs: 1.3 10*3/uL (ref 0.7–4.0)
Monocytes Relative: 6.5 % (ref 3.0–12.0)
Platelets: 248 10*3/uL (ref 150.0–400.0)
RDW: 13.5 % (ref 11.5–14.6)

## 2010-08-10 LAB — HEPATIC FUNCTION PANEL
ALT: 16 U/L (ref 0–35)
AST: 17 U/L (ref 0–37)
Albumin: 4.6 g/dL (ref 3.5–5.2)
Total Protein: 7.9 g/dL (ref 6.0–8.3)

## 2010-08-10 LAB — LIPID PANEL
Cholesterol: 181 mg/dL (ref 0–200)
HDL: 61.4 mg/dL (ref >39.00)
VLDL: 11.6 mg/dL (ref 0.0–40.0)

## 2010-08-10 LAB — URINALYSIS
Bilirubin Urine: NEGATIVE
Hgb urine dipstick: NEGATIVE
Total Protein, Urine: NEGATIVE
Urine Glucose: NEGATIVE

## 2010-08-10 LAB — BASIC METABOLIC PANEL
Chloride: 107 mEq/L (ref 96–112)
GFR: 88.45 mL/min (ref >60.00)
Glucose, Bld: 89 mg/dL (ref 70–99)
Potassium: 4.2 mEq/L (ref 3.5–5.1)
Sodium: 144 mEq/L (ref 135–145)

## 2010-08-10 LAB — SEDIMENTATION RATE: Sed Rate: 18 mm/hr (ref 0–22)

## 2010-08-10 MED ORDER — MILNACIPRAN HCL 50 MG PO TABS: 50.0000 mg | ORAL_TABLET | Freq: Two times a day (BID) | ORAL | Status: DC

## 2010-08-10 MED ORDER — FLUOXETINE HCL 20 MG PO CAPS: 20.0000 mg | ORAL_CAPSULE | Freq: Every day | ORAL | Status: DC

## 2010-08-10 MED ORDER — SUMATRIPTAN SUCCINATE 100 MG PO TABS: 100.0000 mg | ORAL_TABLET | Freq: Every day | ORAL | Status: DC | PRN

## 2010-08-10 MED ORDER — ELETRIPTAN HYDROBROMIDE 40 MG PO TABS: 40.0000 mg | ORAL_TABLET | ORAL | Status: DC | PRN

## 2010-08-10 MED ORDER — LEVOTHYROXINE SODIUM 25 MCG PO TABS: 25.0000 ug | ORAL_TABLET | Freq: Every day | ORAL | Status: DC

## 2010-08-10 NOTE — Progress Notes (Signed)
  Subjective:    Patient ID: Jamie Roberts, female    DOB: Jan 20, 1956, 55 y.o.   MRN: [id number]  HPI The patient is here for a wellness exam The patient is here to follow up on chronic depression, anxiety, headaches and chronic moderate fibromyalgia symptoms controlled with medicines, diet and exercise. FMS is better on Rx.   Review of Systems  Constitutional: Negative for chills, activity change, appetite change, fatigue and unexpected weight change.  HENT: Negative for congestion, mouth sores and sinus pressure.   Eyes: Negative for visual disturbance.  Respiratory: Negative for cough and chest tightness.   Gastrointestinal: Negative for nausea and abdominal pain.  Genitourinary: Negative for frequency, difficulty urinating and vaginal pain.  Musculoskeletal: Positive for arthralgias (better). Negative for back pain and gait problem.  Skin: Negative for pallor and rash.  Neurological: Negative for dizziness, tremors, weakness, numbness and headaches.  Psychiatric/Behavioral: Negative for confusion, sleep disturbance and dysphoric mood.       Objective:   Physical Exam  Constitutional: She appears well-developed and well-nourished. No distress.  HENT:  Head: Normocephalic.  Right Ear: External ear normal.  Left Ear: External ear normal.  Nose: Nose normal.  Mouth/Throat: Oropharynx is clear and moist.  Eyes: Conjunctivae are normal. Pupils are equal, round, and reactive to light. Right eye exhibits no discharge. Left eye exhibits no discharge.  Neck: Normal range of motion. Neck supple. No JVD present. No tracheal deviation present. No thyromegaly present.  Cardiovascular: Normal rate, regular rhythm and normal heart sounds.   Pulmonary/Chest: No stridor. No respiratory distress. She has no wheezes.  Abdominal: Soft. Bowel sounds are normal. She exhibits no distension and no mass. There is no tenderness. There is no rebound and no guarding.  Musculoskeletal: She exhibits no edema  and no tenderness.  Lymphadenopathy:    She has no cervical adenopathy.  Neurological: She displays normal reflexes. No cranial nerve deficit. She exhibits normal muscle tone. Coordination normal.  Skin: No rash noted. No erythema.  Psychiatric: She has a normal mood and affect. Her behavior is normal. Judgment and thought content normal.          Assessment & Plan:

## 2010-08-10 NOTE — Assessment & Plan Note (Addendum)
We discussed age appropriate health related issues, including available/recomended screening tests and vaccinations. We discussed a need for adhering to healthy diet and exercise. Labs/EKG were reviewed/ordered. All questions were answered. Needs colonoscopy - she is aware

## 2010-08-11 ENCOUNTER — Telehealth: Payer: Self-pay | Admitting: Internal Medicine

## 2010-08-11 DIAGNOSIS — M797 Fibromyalgia: Secondary | ICD-10-CM | POA: Insufficient documentation

## 2010-08-11 LAB — VITAMIN B12: Vitamin B-12: >1500 pg/mL — ABNORMAL HIGH (ref 211–911)

## 2010-08-11 LAB — TSH: TSH: 4.67 u[IU]/mL (ref 0.35–5.50)

## 2010-08-11 NOTE — Assessment & Plan Note (Signed)
Better  

## 2010-08-11 NOTE — Telephone Encounter (Signed)
Jamie Roberts, please, inform patient that all labs are normal except for B12 - too high. Cut back on B12 - qod Thx

## 2010-08-11 NOTE — Assessment & Plan Note (Signed)
Better on Savella. Cont Rx

## 2010-08-12 NOTE — Telephone Encounter (Signed)
Called above number and spoke to female who states pt will not be available until 08-22-10. Will call pt then.

## 2010-08-20 ENCOUNTER — Other Ambulatory Visit: Payer: Self-pay | Admitting: Internal Medicine

## 2010-08-24 NOTE — Telephone Encounter (Signed)
Left mess for patient to call back.  

## 2010-08-27 NOTE — Telephone Encounter (Signed)
Pt informed

## 2010-09-06 ENCOUNTER — Other Ambulatory Visit: Payer: Self-pay | Admitting: Women's Health

## 2010-09-06 DIAGNOSIS — Z1231 Encounter for screening mammogram for malignant neoplasm of breast: Secondary | ICD-10-CM

## 2010-09-29 ENCOUNTER — Ambulatory Visit (HOSPITAL_COMMUNITY): Payer: BC Managed Care – PPO

## 2010-09-29 ENCOUNTER — Encounter: Payer: BC Managed Care – PPO | Admitting: Women's Health

## 2010-10-07 ENCOUNTER — Ambulatory Visit (INDEPENDENT_AMBULATORY_CARE_PROVIDER_SITE_OTHER): Payer: BC Managed Care – PPO | Admitting: Women's Health

## 2010-10-07 ENCOUNTER — Encounter: Payer: Self-pay | Admitting: Women's Health

## 2010-10-07 ENCOUNTER — Other Ambulatory Visit (HOSPITAL_COMMUNITY)
Admission: RE | Admit: 2010-10-07 | Discharge: 2010-10-07 | Disposition: A | Discharge status: Home / Self Care | Payer: BC Managed Care – PPO | Source: Ambulatory Visit | Attending: Gynecology | Admitting: Gynecology

## 2010-10-07 ENCOUNTER — Ambulatory Visit (HOSPITAL_COMMUNITY)
Admission: RE | Admit: 2010-10-07 | Discharge: 2010-10-07 | Disposition: A | Discharge status: Home / Self Care | Payer: BC Managed Care – PPO | Source: Ambulatory Visit | Attending: Women's Health | Admitting: Women's Health

## 2010-10-07 VITALS — BP 130/90 | Ht 66.0 in | Wt 146.0 lb

## 2010-10-07 DIAGNOSIS — N959 Unspecified menopausal and perimenopausal disorder: Secondary | ICD-10-CM

## 2010-10-07 DIAGNOSIS — Z1231 Encounter for screening mammogram for malignant neoplasm of breast: Secondary | ICD-10-CM

## 2010-10-07 DIAGNOSIS — Z01419 Encounter for gynecological examination (general) (routine) without abnormal findings: Secondary | ICD-10-CM

## 2010-10-07 MED ORDER — ESTRADIOL-NORETHINDRONE ACET 0.05-0.14 MG/DAY TD PTTW: 1.0000 | MEDICATED_PATCH | TRANSDERMAL | Status: DC

## 2010-10-07 NOTE — Progress Notes (Signed)
Addended by: Bertram Savin A on: 10/07/2010 08:52 AM   Modules accepted: Orders

## 2010-10-07 NOTE — Progress Notes (Signed)
Jamie Roberts 1956/01/21 [id number]    History:    The patient presents for annual exam.  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   ROS:  A  ROS was performed and pertinent positives and negatives are included in the history.  Exam:  Filed Vitals:   10/07/10 0753  BP: 130/90    General appearance:  Normal Head/Neck:  Normal, without cervical or supraclavicular adenopathy. Thyroid:  Symmetrical, normal in size, without palpable masses or nodularity. Respiratory  Effort:  Normal  Auscultation:  Clear without wheezing or rhonchi Cardiovascular  Auscultation:  Regular rate, without rubs, murmurs or gallops  Edema/varicosities:  Not grossly evident Abdominal   Soft,nontender, without masses, guarding or rebound.  Liver/spleen:  No organomegaly noted  Hernia:  None appreciated  Occult test:   Skin  Inspection:  Grossly normal  Palpation:  Grossly normal Neurologic/psychiatric  Orientation:  Normal with appropriate conversation.  Mood/affect:  Normal  Genitourinary    Breasts: Examined lying and sitting.     Right: Without masses, retractions, discharge or axillary adenopathy.     Left: Without masses, retractions, discharge or axillary adenopathy.   Inguinal/mons:  Normal without inguinal adenopathy  External genitalia:  Normal  BUS/Urethra/Skene's glands:  Normal  Bladder:  Normal  Vagina:  Normal  Cervix:  Normal  Uterus:  normal in size, shape and contour.  Midline and mobile  Adnexa/parametria:     Rt: Without masses or tenderness.   Lt: Without masses or tenderness.  Anus and perineum: Normal  Digital rectal exam: Normal sphincter tone without palpated masses or tenderness  Assessment/Plan:  55 y.o. DWF G1P1  for annual exam.   Postmenopausal with no bleeding on CombiPatch 0.4/0.14  with minimal hot flushes. States has felt better on the patch. Prescription, proper use was given did review slight risk for blood clots strokes breast cancer she is aware. She had a mammogram this morning, SBEs, exercise, calcium rich diet encouraged. She is not sexually active, did  encourage condoms and  lubricant for dryness. Primary care does her labs and medications. She is planning  colonoscopy for December. She has not had a bone density due to her insurance coverage but is [employer] and will have that done afterwards did review she can have that done here. Continue vitamin D 2000 daily, Pap only today.   Harrington Challenger [facility], 8:32 AM 10/07/2010

## 2011-02-11 ENCOUNTER — Encounter: Payer: Self-pay | Admitting: Internal Medicine

## 2011-02-11 ENCOUNTER — Other Ambulatory Visit: Payer: Self-pay | Admitting: *Deleted

## 2011-02-11 ENCOUNTER — Ambulatory Visit (INDEPENDENT_AMBULATORY_CARE_PROVIDER_SITE_OTHER): Payer: BC Managed Care – PPO | Admitting: Internal Medicine

## 2011-02-11 VITALS — BP 138/90 | HR 80 | Temp 97.8°F | Resp 16 | Wt 152.0 lb

## 2011-02-11 DIAGNOSIS — M797 Fibromyalgia: Secondary | ICD-10-CM

## 2011-02-11 DIAGNOSIS — G43919 Migraine, unspecified, intractable, without status migrainosus: Secondary | ICD-10-CM

## 2011-02-11 DIAGNOSIS — F329 Major depressive disorder, single episode, unspecified: Secondary | ICD-10-CM

## 2011-02-11 DIAGNOSIS — IMO0001 Reserved for inherently not codable concepts without codable children: Secondary | ICD-10-CM

## 2011-02-11 DIAGNOSIS — G43809 Other migraine, not intractable, without status migrainosus: Secondary | ICD-10-CM

## 2011-02-11 DIAGNOSIS — M2550 Pain in unspecified joint: Secondary | ICD-10-CM

## 2011-02-11 MED ORDER — CLOBETASOL PROPIONATE 0.05 % EX FOAM: Freq: Two times a day (BID) | CUTANEOUS | Status: DC | PRN

## 2011-02-11 MED ORDER — ELETRIPTAN HYDROBROMIDE 40 MG PO TABS: 40.0000 mg | ORAL_TABLET | ORAL | Status: AC | PRN

## 2011-02-11 MED ORDER — FLUOXETINE HCL 20 MG PO CAPS: 20.0000 mg | ORAL_CAPSULE | Freq: Every day | ORAL | Status: DC

## 2011-02-11 MED ORDER — LEVOTHYROXINE SODIUM 25 MCG PO TABS: 25.0000 ug | ORAL_TABLET | Freq: Every day | ORAL | Status: DC

## 2011-02-11 MED ORDER — SUMATRIPTAN SUCCINATE 100 MG PO TABS: 100.0000 mg | ORAL_TABLET | Freq: Every day | ORAL | Status: DC | PRN

## 2011-02-11 MED ORDER — MILNACIPRAN HCL 50 MG PO TABS: 50.0000 mg | ORAL_TABLET | Freq: Two times a day (BID) | ORAL | Status: DC

## 2011-02-11 NOTE — Progress Notes (Signed)
  Subjective:    Patient ID: Jamie Roberts, female    DOB: 1955/06/06, 55 y.o.   MRN: [id number]  HPI  The patient is here to follow up on chronic depression, headaches and chronic moderate fibromyalgia symptoms controlled with medicines, diet and exercise.    Review of Systems  Constitutional: Positive for fatigue (better). Negative for chills, activity change, appetite change and unexpected weight change.  HENT: Negative for congestion, mouth sores and sinus pressure.   Eyes: Negative for visual disturbance.  Respiratory: Negative for cough and chest tightness.   Gastrointestinal: Negative for nausea and abdominal pain.  Genitourinary: Negative for frequency, difficulty urinating and vaginal pain.  Musculoskeletal: Positive for back pain and arthralgias (better). Negative for gait problem.  Skin: Negative for pallor and rash.  Neurological: Positive for headaches. Negative for dizziness, tremors, weakness and numbness.  Psychiatric/Behavioral: Negative for suicidal ideas, confusion and sleep disturbance.       Objective:   Physical Exam  Constitutional: She appears well-developed. No distress.  HENT:  Head: Normocephalic.  Right Ear: External ear normal.  Left Ear: External ear normal.  Nose: Nose normal.  Mouth/Throat: Oropharynx is clear and moist.  Eyes: Conjunctivae are normal. Pupils are equal, round, and reactive to light. Right eye exhibits no discharge. Left eye exhibits no discharge.  Neck: Normal range of motion. Neck supple. No JVD present. No tracheal deviation present. No thyromegaly present.  Cardiovascular: Normal rate, regular rhythm and normal heart sounds.   Pulmonary/Chest: No stridor. No respiratory distress. She has no wheezes.  Abdominal: Soft. Bowel sounds are normal. She exhibits no distension and no mass. There is no tenderness. There is no rebound and no guarding.  Musculoskeletal: She exhibits tenderness (LS is tender). She exhibits no edema.    Lymphadenopathy:    She has no cervical adenopathy.  Neurological: She displays normal reflexes. No cranial nerve deficit. She exhibits normal muscle tone. Coordination normal.  Skin: No rash noted. No erythema.  Psychiatric: She has a normal mood and affect. Her behavior is normal. Judgment and thought content normal.          Assessment & Plan:

## 2011-02-11 NOTE — Assessment & Plan Note (Signed)
Continue with current prescription therapy as reflected on the Med list.  

## 2011-02-11 NOTE — Assessment & Plan Note (Addendum)
Continue with current prescription therapy as reflected on the Med list. Relpax OR Imitrex prn

## 2011-02-24 ENCOUNTER — Telehealth: Payer: Self-pay | Admitting: *Deleted

## 2011-02-24 NOTE — Telephone Encounter (Signed)
Called pt's ins number for PA on generic Imitrex 100 mg. Gave all pertinent info by phone. Was advised we should receive a fax with approval/denial within 24-48 hours.

## 2011-03-03 NOTE — Telephone Encounter (Signed)
Rec fax from [facility] stating Imitrex is denied because pt uses Relpax along with it. Pt informed of this and states she will be fine to take Relpax alone  but the last time she went to fill it was approx $300. I called pharmacy and the lady I talked to states the plan copay for this med is now $299. Probably from a coinsurance or deductible. Pt states she would now like to stop Relpax and try to intitate PA for Imitrex again.

## 2011-03-17 NOTE — Telephone Encounter (Signed)
Appeal letter completed/printed/pending MD sig to fax to appeal dept. At [phone].

## 2011-03-18 NOTE — Telephone Encounter (Signed)
Appeal letter signed by MD and faxed to appeals dept at [phone]. Will wait for decision from [facility].

## 2011-04-24 ENCOUNTER — Other Ambulatory Visit: Payer: Self-pay | Admitting: Internal Medicine

## 2011-07-20 ENCOUNTER — Encounter: Payer: Self-pay | Admitting: Internal Medicine

## 2011-07-20 ENCOUNTER — Ambulatory Visit (INDEPENDENT_AMBULATORY_CARE_PROVIDER_SITE_OTHER): Payer: BC Managed Care – PPO | Admitting: Internal Medicine

## 2011-07-20 VITALS — BP 120/90 | HR 73 | Temp 97.0°F | Ht 67.0 in | Wt 150.5 lb

## 2011-07-20 DIAGNOSIS — R030 Elevated blood-pressure reading, without diagnosis of hypertension: Secondary | ICD-10-CM | POA: Insufficient documentation

## 2011-07-20 DIAGNOSIS — F329 Major depressive disorder, single episode, unspecified: Secondary | ICD-10-CM

## 2011-07-20 DIAGNOSIS — G43809 Other migraine, not intractable, without status migrainosus: Secondary | ICD-10-CM

## 2011-07-20 MED ORDER — METOPROLOL TARTRATE 25 MG PO TABS: 25.0000 mg | ORAL_TABLET | Freq: Two times a day (BID) | ORAL | Status: DC

## 2011-07-20 NOTE — Assessment & Plan Note (Signed)
BP Readings from Last 3 Encounters:  07/20/11 120/90  02/11/11 138/90  10/07/10 130/90   Not clear that she has sustained essential HTN, but maybe pre-HTN;  For the metroprolol 25 bid more for migraine prophylaxis,  to f/u any worsening symptoms or concerns

## 2011-07-20 NOTE — Assessment & Plan Note (Signed)
imitrex helping , but with elev BP and chronic recurring pain, ok for metoprolol 25 bid for prophylaxis purposes,  to f/u any worsening symptoms or concerns

## 2011-07-20 NOTE — Patient Instructions (Signed)
Take all new medications as prescribed Continue all other medications as before  

## 2011-07-20 NOTE — Assessment & Plan Note (Signed)
stable overall by hx and exam, and pt to continue medical treatment as before 

## 2011-07-20 NOTE — Progress Notes (Signed)
Subjective:    Patient ID: Jamie Roberts, female    DOB: September 23, 1955, 56 y.o.   MRN: [id number]  HPI  56yoWF pt of Dr Posey Rea, hx of chronic recurrent migraine, depression/anxiety here after recent HA complicated by elevated BP documented 170/104 and 154/92 earlier today  - 0400 and 1330 at home);  Not clear if HA started before or after the elev BP, but HA seemed different from her typical right sided migraine.  No HA at this time and Pt denies new neurological symptoms such as facial or extremity weakness or numbness.  Incidentally took a friends metoprolol and BP seemed to improve earlier today.  Migraines occur currently about 1-2 times per wk.  Denies worsening depressive symptoms, suicidal ideation, or panic, though has ongoing anxiety.   Pt denies polydipsia, polyuria.  Pt denies chest pain, increased sob or doe, wheezing, orthopnea, PND, increased LE swelling, palpitations, dizziness or syncope.   Pt denies fever, wt loss, night sweats, loss of appetite, or other constitutional symptoms Past Medical History  Diagnosis Date  . Jaw pain   . Herpes zoster   . Variants of migraine without mention of intractable migraine   . Cataracts, bilateral 2010  . Psoriasis 2010  . Osteoarthritis   . GERD (gastroesophageal reflux disease)   . Fibromyalgia    Past Surgical History  Procedure Date  . Tonsillectomy     AGE 56    reports that she has never smoked. She has never used smokeless tobacco. She reports that she does not drink alcohol or use illicit drugs. family history is not on file. Allergies  Allergen Reactions  . Meloxicam     REACTION: nausea   Current Outpatient Prescriptions on File Prior to Visit  Medication Sig Dispense Refill  . Cholecalciferol 1000 UNITS tablet Take 2,000 Units by mouth daily.        . clobetasol (OLUX) 0.05 % topical foam Apply topically 2 (two) times daily as needed. Please dispense 3 mo supply.  300 g  1  . estradiol-norethindrone (COMBIPATCH) 0.05-0.14  MG/DAY Place 1 patch onto the skin 2 (two) times a week.  24 patch  4  . FLUoxetine (PROZAC) 20 MG capsule Take 1 capsule (20 mg total) by mouth daily.  90 capsule  3  . levothyroxine (SYNTHROID, LEVOTHROID) 25 MCG tablet Take 1 tablet (25 mcg total) by mouth daily.  90 tablet  3  . levothyroxine (SYNTHROID, LEVOTHROID) 25 MCG tablet TAKE 1 TABLET BY MOUTH EVERY DAY  90 tablet  1  . Milnacipran (SAVELLA) 50 MG TABS Take 1 tablet (50 mg total) by mouth 2 (two) times daily. For fibromyalgia  180 tablet  3  . SUMAtriptan (IMITREX) 100 MG tablet TAKE 1 TABLET ONCE DAILY AS NEEDED  12 tablet  4  . eletriptan (RELPAX) 40 MG tablet One tablet by mouth as needed for migraine headache.  If the headache improves and then returns, dose may be repeated after 2 hours have elapsed since first dose (do not exceed 80 mg per day). may repeat in 2 hours if necessary  36 tablet  3  . metoprolol tartrate (LOPRESSOR) 25 MG tablet Take 1 tablet (25 mg total) by mouth 2 (two) times daily.  180 tablet  3  . promethazine (PHENERGAN) 25 MG tablet Take 1 tablet (25 mg total) by mouth every 6 (six) hours as needed for nausea.  30 tablet  1   Current Facility-Administered Medications on File Prior to Visit  Medication Dose Route Frequency  Provider Last Rate Last Dose  . promethazine (PHENERGAN) injection 25 mg  25 mg Intramuscular Q6H PRN Newt Lukes, MD   25 mg at 07/08/10 1221   Review of Systems Review of Systems  Constitutional: Negative for diaphoresis and unexpected weight change.  HENT: Negative for drooling and tinnitus.   Eyes: Negative for photophobia and visual disturbance.  Respiratory: Negative for choking and stridor.   Musculoskeletal: Negative for gait problem.  Skin: Negative for color change and wound.  Neurological: Negative for tremors and numbness.  Psychiatric/Behavioral: Negative for decreased concentration. The patient is not hyperactive.       Objective:   Physical Exam BP 120/90   Pulse 73  Temp(Src) 97 F (36.1 C) (Oral)  Ht 5\' 7"  (1.702 m)  Wt 150 lb 8 oz (68.266 kg)  BMI 23.57 kg/m2  SpO2 99% Physical Exam  VS noted Constitutional: Pt appears well-developed and well-nourished.  HENT: Head: Normocephalic.  Right Ear: External ear normal.  Left Ear: External ear normal.  Eyes: Conjunctivae and EOM are normal. Pupils are equal, round, and reactive to light.  Neck: Normal range of motion. Neck supple.  Cardiovascular: Normal rate and regular rhythm.   Pulmonary/Chest: Effort normal and breath sounds normal.  Neurological: Pt is alert. Not confused Skin: Skin is warm. No erythema.  Psychiatric: Pt behavior is normal. Thought content normal. 1+ nervous/tense    Assessment & Plan:

## 2011-09-19 ENCOUNTER — Ambulatory Visit: Payer: BC Managed Care – PPO | Admitting: Internal Medicine

## 2011-10-27 ENCOUNTER — Other Ambulatory Visit: Payer: Self-pay | Admitting: Women's Health

## 2012-01-16 ENCOUNTER — Other Ambulatory Visit: Payer: Self-pay | Admitting: Women's Health

## 2012-01-30 ENCOUNTER — Other Ambulatory Visit: Payer: Self-pay | Admitting: Women's Health

## 2012-01-30 DIAGNOSIS — Z1231 Encounter for screening mammogram for malignant neoplasm of breast: Secondary | ICD-10-CM

## 2012-01-31 ENCOUNTER — Ambulatory Visit (HOSPITAL_COMMUNITY)
Admission: RE | Admit: 2012-01-31 | Discharge: 2012-01-31 | Disposition: A | Discharge status: Home / Self Care | Payer: BC Managed Care – PPO | Source: Ambulatory Visit | Attending: Women's Health | Admitting: Women's Health

## 2012-01-31 DIAGNOSIS — Z1231 Encounter for screening mammogram for malignant neoplasm of breast: Secondary | ICD-10-CM

## 2012-02-01 ENCOUNTER — Ambulatory Visit (INDEPENDENT_AMBULATORY_CARE_PROVIDER_SITE_OTHER): Payer: BC Managed Care – PPO | Admitting: Women's Health

## 2012-02-01 ENCOUNTER — Encounter: Payer: Self-pay | Admitting: Women's Health

## 2012-02-01 VITALS — BP 142/108 | Ht 66.5 in | Wt 156.0 lb

## 2012-02-01 DIAGNOSIS — Z01419 Encounter for gynecological examination (general) (routine) without abnormal findings: Secondary | ICD-10-CM

## 2012-02-01 DIAGNOSIS — N959 Unspecified menopausal and perimenopausal disorder: Secondary | ICD-10-CM

## 2012-02-01 MED ORDER — ESTRADIOL-NORETHINDRONE ACET 0.05-0.14 MG/DAY TD PTTW: 1.0000 | MEDICATED_PATCH | TRANSDERMAL | Status: DC

## 2012-02-01 NOTE — Progress Notes (Signed)
Jamie Roberts 1955/06/05 [id number]    History:    The patient presents for annual exam.  Postmenopausal, stopped HRT 6 months ago and states has felt poorly, hot flushes, poor sleep and mood. Hypertension primary care labs and meds. History of normal Paps and mammograms. Not sexually active-years. Has not had a DEXA, insurance would not cover, has not had a colonoscopy.   Past medical history, past surgical history, family history and social history were all reviewed and documented in the EPIC chart. 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 Son [place] graduating [employer]. doing well, thinking of med school.   ROS:  A  ROS was performed and pertinent positives and negatives are included in the history.  Exam:  Filed Vitals:   02/01/12 1017  BP: 142/108    General appearance:  Normal Head/Neck:  Normal, without cervical or supraclavicular adenopathy. Thyroid:  Symmetrical, normal in size, without palpable masses or nodularity. Respiratory  Effort:  Normal  Auscultation:  Clear without wheezing or rhonchi Cardiovascular  Auscultation:  Regular rate, without rubs, murmurs or gallops  Edema/varicosities:  Not grossly evident Abdominal  Soft,nontender, without masses, guarding or rebound.  Liver/spleen:  No organomegaly noted  Hernia:  None appreciated  Skin  Inspection:  Grossly normal  Palpation:  Grossly normal Neurologic/psychiatric  Orientation:  Normal with appropriate conversation.  Mood/affect:  Normal  Genitourinary    Breasts: Examined lying and sitting.     Right: Without masses, retractions, discharge or axillary adenopathy.     Left: Without masses, retractions, discharge or axillary adenopathy.   Inguinal/mons:  Normal without inguinal adenopathy  External genitalia:  Normal  BUS/Urethra/Skene's glands:  Normal  Bladder:  Normal  Vagina:  Normal  Cervix:  Normal  Uterus:   normal in size, shape and contour.  Midline and  mobile  Adnexa/parametria:     Rt: Without masses or tenderness.   Lt: Without masses or tenderness.  Anus and perineum: Normal  Digital rectal exam: Normal sphincter tone without palpated masses or tenderness  Assessment/Plan:  56 y.o.  DW F. G1 P1 for annual exam requesting refill of HRT.  Hypertension primary care labs and meds Menopause Symptoms  Plan: Reviewed importance of scheduling a screening colonoscopy. States will do through primary care. Has followup scheduled with primary care for hypertension. Worked night shift last night, came here and states blood pressure is normally lower. Is aware of hazards of hypertension. CombiPatch 0.14/0.05 patch twice weekly prescription, proper use, slight risk for blood clots, strokes, breast cancer reviewed. Reviewed importance of hypertension control when using HRT. SBE's, exercise, calcium rich diet, vitamin D 2000 daily encouraged. Condoms encouraged if become sexually active. Bone density reviewed, will schedule. Pap normal 2012, new screening guidelines reviewed.    Harrington Challenger [facility], 12:58 PM 02/01/2012

## 2012-02-01 NOTE — Patient Instructions (Addendum)
Vit D 2000 daily Schedule colonoscopy  Health Recommendations for Postmenopausal Women Based on the Results of the Women's Health Initiative [facility]) and Other Studies The WHI is a major 15-year research program to address the most common causes of death, disability and poor quality of life in postmenopausal women. Some of these causes are heart disease, cancer, bone loss (osteoporosis) and others. Taking into account all of the findings from [facility] and other studies, here are bottom-line health recommendations for women: CARDIOVASCULAR DISEASE Heart Disease: A heart attack is a medical emergency. Know the signs and symptoms of a heart attack. Hormone therapy should not be used to prevent heart disease. In women with heart disease, hormone therapy should not be used to prevent further disease. Hormone therapy increases the risk of blood clots. Below are things women can do to reduce their risk for heart disease.   Do not smoke. If you smoke, quit. Women who smoke are 2 to 6 times more likely to suffer a heart attack than non-smoking women.  Aim for a healthy weight. Being overweight causes many preventable deaths. Eat a healthy and balanced diet and drink an adequate amount of liquids.  Get moving. Make a commitment to be more physically active. Aim for 30 minutes of activity on most, if not all days of the week.  Eat for heart health. Choose a diet that is low in saturated fat, trans fat, and cholesterol. Include whole grains, vegetables, and fruits. Read the labels on the food container before buying it.  Know your numbers. Ask your caregiver to check your blood pressure, cholesterol (total, HDL, LDL, triglycerides) and blood glucose. Work with your caregiver to improve any numbers that are not normal.  High blood pressure. Limit or stop your table salt intake (try salt substitute and food seasonings), avoid salty foods and drinks. Read the labels on the food container before buying it. Avoid  becoming overweight by eating well and exercising. STROKE  Stroke is a medical emergency. Stroke can be the result of a blood clot in the blood vessel in the brain or by a brain hemorrhage (bleeding). Know the signs and symptoms of a stroke. To lower the risk of developing a stroke:  Avoid fatty foods.  Quit smoking.  Control your diabetes, blood pressure, and irregular heart rate. THROMBOPHLIBITIS (BLOOD CLOT) OF THE LEG  Hormone treatment is a big cause of developing blood clots in the leg. Becoming overweight and leading a stationary lifestyle also may contribute to developing blood clots. Controlling your diet and exercising will help lower the risk of developing blood clots. CANCER SCREENING  Breast Cancer: Women should take steps to reduce their risk of breast cancer. This includes having regular mammograms, monthly self breast exams and regular breast exams by your caregiver. Have a mammogram every one to two years if you are 56 to 55 years old. Have a mammogram annually if you are 45 years old or older depending on your risk factors. Women who are high risk for breast cancer may need more frequent mammograms. There are tests available (testing the genes in your body) if you have family history of breast cancer called BRCA 1 and 2. These tests can help determine the risks of developing breast cancer.  Intestinal or Stomach Cancer: Women should talk to their caregiver about when to start screening, what tests and how often they should be done, and the benefits and risks of doing these tests. Tests to consider are a rectal exam, fecal occult blood, sigmoidoscopy,  colononoscoby, barium enema and upper GI series of the stomach. Depending on the age, you may want to get a medical and family history of colon cancer. Women who are high risk may need to be screened at an earlier age and more often.  Cervical Cancer: A Pap test of the cervix should be done every year and every 3 years when there has  been three straight years of a normal Pap test. Women with an abnormal Pap test should be screened more often or have a cervical biopsy depending on your caregiver's recommendation.  Uterine Cancer: If you have vaginal bleeding after you are in the menopause, it should be evaluated by your caregiver.  Ovarian cancer: There are no reliable tests available to screen for ovarian cancer at this time except for yearly pelvic exams.  Lung Cancer: Yearly chest X-rays can detect lung cancer and should be done on high risk women, such as cigarette smokers and women with chronic lung disease (emphysemia).  Skin Cancer: A complete body skin exam should be done at your yearly examination. Avoid overexposure to the sun and ultraviolet light lamps. Use a strong sun block cream when in the sun. All of these things are important in lowering the risk of skin cancer. MENOPAUSE Menopause Symptoms: Hormone therapy products are effective for treating symptoms associated with menopause:  Moderate to severe hot flashes.  Night sweats.  Mood swings.  Headaches.  Tiredness.  Loss of sex drive.  Insomnia.  Other symptoms. However, hormone therapy products carry serious risks, especially in older women. Women who use or are thinking about using estrogen or estrogen with progestin treatments should discuss that with their caregiver. Your caregiver will know if the benefits outweigh the risks. The Food and Drug Administration (FDA) has concluded that hormone therapy should be used only at the lowest doses and for the shortest amount of time to reach treatment goals. It is not known at what doses there may be less risk of serious side effects. There are other treatments such as herbal medication (not controlled or regulated by the FDA), group therapy, counseling and acupuncture that may be helpful. OSTEOPOROSIS Protecting Against Bone Loss and Preventing Fracture: If hormone therapy is used for prevention of bone  loss (osteoporosis), the risks for bone loss must outweigh the risk of the therapy. Women considering taking hormone therapy for bone loss should ask their health care providers about other medications (fosamax and boniva) that are considered safe and effective for preventing bone loss and bone fractures. To guard against bone loss or fractures, it is recommended that women should take at least 1000-1500 mg of calcium and 400-800 IU of vitamin D daily in divided doses. Smoking and excessive alcohol intake increases the risk of osteoporosis. Eat foods rich in calcium and vitamin D and do weight bearing exercises several times a week as your caregiver suggests. DIABETES Diabetes Melitus: Women with Type I or Type 2 diabetes should keep their diabetes in control with diet, exercise and medication. Avoid too many sweets, starchy and fatty foods. Being overweight can affect your diabetes. COGNITION AND MEMORY Cognition and Memory: Menopausal hormone therapy is not recommended for the prevention of cognitive disorders such as Alzheimer's disease or memory loss. WHI found that women treated with hormone therapy have a greater risk of developing dementia.  DEPRESSION  Depression may occur at any age, but is common in elderly women. The reasons may be because of physical, medical, social (loneliness), financial and/or economic problems and needs. Becoming  involved with church, volunteer or social groups, seeking treatment for any physical or medical problems is recommended. Also, look into getting professional advice for any economic or financial problems. ACCIDENTS  Accidents are common and can be serious in the elderly woman. Prepare your house to prevent accidents. Eliminate throw rugs, use hip protectors, place hand bars in the bath, shower and toilet areas. Avoid wearing high heel shoes and walking on wet, snowy and icy areas. Stop driving if you have vision, hearing problems or are unsteady with you movements  and reflexes. RHEUMATOID ARTHRITIS Rheumatoid arthritis causes pain, swelling and stiffness of your bone joints. It can limit many of your activities. Over-the-counter medications may help, but prescription medications may be necessary. Talk with your caregiver about this. Exercise (walking, water aerobics), good posture, using splints on painful joints, warm baths or applying warm compresses to stiff joints and cold compresses to painful joints may be helpful. Smoking and excessive drinking may worsen the symptoms of arthritis. Seek help from a physical therapist if the arthritis is becoming a problem with your daily activities. IMMUNIZATIONS  Several immunizations are important to have during your senior years, including:   Tetanus and a diptheria shot booster every 10 years.  Influenza every year before the flu season begins.  Pneumonia vaccine.  Shingles vaccine.  Others as indicated (example: H1N1 vaccine). Document Released: 03/25/2005 Document Revised: 04/25/2011 Document Reviewed: 11/19/2007 [facility] Patient Information 2013 [place], [place].

## 2012-02-14 ENCOUNTER — Encounter: Payer: Self-pay | Admitting: Internal Medicine

## 2012-02-14 ENCOUNTER — Other Ambulatory Visit (INDEPENDENT_AMBULATORY_CARE_PROVIDER_SITE_OTHER): Payer: BC Managed Care – PPO

## 2012-02-14 ENCOUNTER — Ambulatory Visit (INDEPENDENT_AMBULATORY_CARE_PROVIDER_SITE_OTHER): Payer: BC Managed Care – PPO | Admitting: Internal Medicine

## 2012-02-14 VITALS — BP 122/82 | HR 65 | Temp 97.7°F | Ht 67.0 in | Wt 155.2 lb

## 2012-02-14 DIAGNOSIS — R030 Elevated blood-pressure reading, without diagnosis of hypertension: Secondary | ICD-10-CM

## 2012-02-14 DIAGNOSIS — Z0000 Encounter for general adult medical examination without abnormal findings: Secondary | ICD-10-CM

## 2012-02-14 DIAGNOSIS — G43809 Other migraine, not intractable, without status migrainosus: Secondary | ICD-10-CM

## 2012-02-14 DIAGNOSIS — IMO0001 Reserved for inherently not codable concepts without codable children: Secondary | ICD-10-CM

## 2012-02-14 DIAGNOSIS — F329 Major depressive disorder, single episode, unspecified: Secondary | ICD-10-CM

## 2012-02-14 DIAGNOSIS — M797 Fibromyalgia: Secondary | ICD-10-CM

## 2012-02-14 LAB — BASIC METABOLIC PANEL
Calcium: 9.3 mg/dL (ref 8.4–10.5)
GFR: 69.97 mL/min (ref >60.00)
Potassium: 4.2 mEq/L (ref 3.5–5.1)
Sodium: 138 mEq/L (ref 135–145)

## 2012-02-14 LAB — CBC WITH DIFFERENTIAL/PLATELET
Basophils Absolute: 0.1 10*3/uL (ref 0.0–0.1)
Basophils Relative: 1 % (ref 0.0–3.0)
Eosinophils Relative: 2.4 % (ref 0.0–5.0)
Hemoglobin: 13.8 g/dL (ref 12.0–15.0)
Lymphocytes Relative: 28.5 % (ref 12.0–46.0)
Monocytes Relative: 8.6 % (ref 3.0–12.0)
Neutro Abs: 4.2 10*3/uL (ref 1.4–7.7)
RBC: 4.79 Mil/uL (ref 3.87–5.11)

## 2012-02-14 LAB — URINALYSIS
Specific Gravity, Urine: <=1.005 (ref 1.000–1.030)
Urine Glucose: NEGATIVE
Urobilinogen, UA: 0.2 (ref 0.0–1.0)
pH: 7 (ref 5.0–8.0)

## 2012-02-14 LAB — HEPATIC FUNCTION PANEL
Bilirubin, Direct: 0.1 mg/dL (ref 0.0–0.3)
Total Bilirubin: 1.1 mg/dL (ref 0.3–1.2)
Total Protein: 8 g/dL (ref 6.0–8.3)

## 2012-02-14 LAB — LIPID PANEL
HDL: 52.1 mg/dL (ref >39.00)
Total CHOL/HDL Ratio: 4
VLDL: 19.6 mg/dL (ref 0.0–40.0)

## 2012-02-14 LAB — TSH: TSH: 6.54 u[IU]/mL — ABNORMAL HIGH (ref 0.35–5.50)

## 2012-02-14 MED ORDER — FLUOXETINE HCL 20 MG PO CAPS: 20.0000 mg | ORAL_CAPSULE | Freq: Every day | ORAL | Status: DC

## 2012-02-14 MED ORDER — MILNACIPRAN HCL 50 MG PO TABS: 50.0000 mg | ORAL_TABLET | Freq: Two times a day (BID) | ORAL | Status: DC

## 2012-02-14 MED ORDER — LEVOTHYROXINE SODIUM 25 MCG PO TABS: 25.0000 ug | ORAL_TABLET | Freq: Every day | ORAL | Status: DC

## 2012-02-14 MED ORDER — SUMATRIPTAN SUCCINATE 100 MG PO TABS: 100.0000 mg | ORAL_TABLET | Freq: Every day | ORAL | Status: DC | PRN

## 2012-02-14 NOTE — Assessment & Plan Note (Signed)
On Metoprolol - better

## 2012-02-14 NOTE — Progress Notes (Signed)
   Subjective:    HPI The patient is here for a wellness exam She was put on Metoprolol for high BP a few mo ago - better The patient is here to follow up on chronic depression, anxiety, headaches and chronic moderate fibromyalgia symptoms controlled with medicines, diet and exercise. FMS is better on Rx.  BP Readings from Last 3 Encounters:  02/14/12 122/82  02/01/12 142/108  07/20/11 120/90   Wt Readings from Last 3 Encounters:  02/14/12 155 lb 4 oz (70.421 kg)  02/01/12 156 lb (70.761 kg)  07/20/11 150 lb 8 oz (68.266 kg)     Review of Systems  Constitutional: Negative for chills, activity change, appetite change, fatigue and unexpected weight change.  HENT: Negative for congestion, mouth sores and sinus pressure.   Eyes: Negative for visual disturbance.  Respiratory: Negative for cough and chest tightness.   Gastrointestinal: Negative for nausea and abdominal pain.  Genitourinary: Negative for frequency, difficulty urinating and vaginal pain.  Musculoskeletal: Positive for arthralgias (better). Negative for back pain and gait problem.  Skin: Negative for pallor and rash.  Neurological: Negative for dizziness, tremors, weakness, numbness and headaches.  Psychiatric/Behavioral: Negative for confusion, sleep disturbance and dysphoric mood.       Objective:   Physical Exam  Constitutional: She appears well-developed and well-nourished. No distress.  HENT:  Head: Normocephalic.  Right Ear: External ear normal.  Left Ear: External ear normal.  Nose: Nose normal.  Mouth/Throat: Oropharynx is clear and moist.  Eyes: Conjunctivae normal are normal. Pupils are equal, round, and reactive to light. Right eye exhibits no discharge. Left eye exhibits no discharge.  Neck: Normal range of motion. Neck supple. No JVD present. No tracheal deviation present. No thyromegaly present.  Cardiovascular: Normal rate, regular rhythm and normal heart sounds.   Pulmonary/Chest: No stridor. No  respiratory distress. She has no wheezes.  Abdominal: Soft. Bowel sounds are normal. She exhibits no distension and no mass. There is no tenderness. There is no rebound and no guarding.  Musculoskeletal: She exhibits no edema and no tenderness.  Lymphadenopathy:    She has no cervical adenopathy.  Neurological: She displays normal reflexes. No cranial nerve deficit. She exhibits normal muscle tone. Coordination normal.  Skin: No rash noted. No erythema.  Psychiatric: She has a normal mood and affect. Her behavior is normal. Judgment and thought content normal.       Lab Results  Component Value Date   WBC 6.8 08/10/2010   HGB 14.3 08/10/2010   HCT 41.5 08/10/2010   PLT 248.0 08/10/2010   GLUCOSE 89 08/10/2010   CHOL 181 08/10/2010   TRIG 58.0 08/10/2010   HDL 61.40 08/10/2010   LDLCALC 108* 08/10/2010   ALT 16 08/10/2010   AST 17 08/10/2010   NA 144 08/10/2010   K 4.2 08/10/2010   CL 107 08/10/2010   CREATININE 0.7 08/10/2010   BUN 10 08/10/2010   CO2 27 08/10/2010   TSH 4.67 08/10/2010   HGBA1C 5.6 05/23/2006      Assessment & Plan:

## 2012-02-14 NOTE — Assessment & Plan Note (Addendum)
We discussed age appropriate health related issues, including available/recomended screening tests and vaccinations. We discussed a need for adhering to healthy diet and exercise. Labs/EKG were reviewed/ordered. All questions were answered. Vaccines are up up to date Declined colonoscopy

## 2012-02-14 NOTE — Assessment & Plan Note (Signed)
Continue with current prescription therapy as reflected on the Med list.  

## 2012-02-16 ENCOUNTER — Other Ambulatory Visit: Payer: Self-pay | Admitting: Internal Medicine

## 2012-02-16 NOTE — Telephone Encounter (Signed)
Jamie Roberts, please, inform patient that all labs are normal except for abn TSH Take Synthroid 50 mcg/d TSH in 6 wks Thx

## 2012-02-16 NOTE — Telephone Encounter (Signed)
Left mess for patient to call back.  

## 2012-02-23 MED ORDER — LEVOTHYROXINE SODIUM 50 MCG PO TABS: 50.0000 ug | ORAL_TABLET | Freq: Every day | ORAL | Status: DC

## 2012-02-23 NOTE — Telephone Encounter (Signed)
Pt aware, she will take 2 of the 25 mcg until gone and then she will go pick up new rx, new rx sent in electronically to pharmacy

## 2012-02-25 ENCOUNTER — Other Ambulatory Visit: Payer: Self-pay | Admitting: Internal Medicine

## 2012-04-11 ENCOUNTER — Other Ambulatory Visit: Payer: Self-pay | Admitting: Women's Health

## 2012-07-04 ENCOUNTER — Other Ambulatory Visit: Payer: Self-pay | Admitting: Internal Medicine

## 2012-07-11 ENCOUNTER — Other Ambulatory Visit: Payer: Self-pay | Admitting: Internal Medicine

## 2012-08-13 ENCOUNTER — Ambulatory Visit (INDEPENDENT_AMBULATORY_CARE_PROVIDER_SITE_OTHER): Payer: BC Managed Care – PPO | Admitting: Internal Medicine

## 2012-08-13 ENCOUNTER — Encounter: Payer: Self-pay | Admitting: Internal Medicine

## 2012-08-13 VITALS — BP 120/70 | HR 68 | Temp 97.3°F | Resp 16 | Wt 159.0 lb

## 2012-08-13 DIAGNOSIS — R030 Elevated blood-pressure reading, without diagnosis of hypertension: Secondary | ICD-10-CM

## 2012-08-13 DIAGNOSIS — F329 Major depressive disorder, single episode, unspecified: Secondary | ICD-10-CM

## 2012-08-13 DIAGNOSIS — G43809 Other migraine, not intractable, without status migrainosus: Secondary | ICD-10-CM

## 2012-08-13 DIAGNOSIS — M797 Fibromyalgia: Secondary | ICD-10-CM

## 2012-08-13 DIAGNOSIS — IMO0001 Reserved for inherently not codable concepts without codable children: Secondary | ICD-10-CM

## 2012-08-13 MED ORDER — LEVOTHYROXINE SODIUM 50 MCG PO TABS: 50.0000 ug | ORAL_TABLET | Freq: Every day | ORAL | Status: DC

## 2012-08-13 MED ORDER — FLUOXETINE HCL 20 MG PO CAPS: 20.0000 mg | ORAL_CAPSULE | Freq: Every day | ORAL | Status: DC

## 2012-08-13 MED ORDER — MILNACIPRAN HCL 50 MG PO TABS: 50.0000 mg | ORAL_TABLET | Freq: Two times a day (BID) | ORAL | Status: DC

## 2012-08-13 MED ORDER — CLOBETASOL PROPIONATE 0.05 % EX FOAM: Freq: Two times a day (BID) | CUTANEOUS | Status: DC | PRN

## 2012-08-13 MED ORDER — METOPROLOL TARTRATE 25 MG PO TABS: 25.0000 mg | ORAL_TABLET | Freq: Two times a day (BID) | ORAL | Status: DC

## 2012-08-13 MED ORDER — SUMATRIPTAN SUCCINATE 100 MG PO TABS: 100.0000 mg | ORAL_TABLET | ORAL | Status: DC | PRN

## 2012-08-13 NOTE — Assessment & Plan Note (Signed)
Continue with current prescription therapy as reflected on the Med list.  

## 2012-08-13 NOTE — Assessment & Plan Note (Signed)
Nl BP °

## 2012-08-13 NOTE — Progress Notes (Signed)
   Subjective:    HPI  She is on Metoprolol for high BP- better The patient is here to follow up on chronic depression, anxiety, headaches and chronic moderate fibromyalgia symptoms controlled with medicines, diet and exercise. FMS is better on Rx.  BP Readings from Last 3 Encounters:  08/13/12 140/100  02/14/12 122/82  02/01/12 142/108   Wt Readings from Last 3 Encounters:  08/13/12 159 lb (72.122 kg)  02/14/12 155 lb 4 oz (70.421 kg)  02/01/12 156 lb (70.761 kg)     Review of Systems  Constitutional: Negative for chills, activity change, appetite change, fatigue and unexpected weight change.  HENT: Negative for congestion, mouth sores and sinus pressure.   Eyes: Negative for visual disturbance.  Respiratory: Negative for cough and chest tightness.   Gastrointestinal: Negative for nausea and abdominal pain.  Genitourinary: Negative for frequency, difficulty urinating and vaginal pain.  Musculoskeletal: Positive for arthralgias (better). Negative for back pain and gait problem.  Skin: Negative for pallor and rash.  Neurological: Negative for dizziness, tremors, weakness, numbness and headaches.  Psychiatric/Behavioral: Negative for confusion, sleep disturbance and dysphoric mood.       Objective:   Physical Exam  Constitutional: She appears well-developed and well-nourished. No distress.  HENT:  Head: Normocephalic.  Right Ear: External ear normal.  Left Ear: External ear normal.  Nose: Nose normal.  Mouth/Throat: Oropharynx is clear and moist.  Eyes: Conjunctivae are normal. Pupils are equal, round, and reactive to light. Right eye exhibits no discharge. Left eye exhibits no discharge.  Neck: Normal range of motion. Neck supple. No JVD present. No tracheal deviation present. No thyromegaly present.  Cardiovascular: Normal rate, regular rhythm and normal heart sounds.   Pulmonary/Chest: No stridor. No respiratory distress. She has no wheezes.  Abdominal: Soft. Bowel  sounds are normal. She exhibits no distension and no mass. There is no tenderness. There is no rebound and no guarding.  Musculoskeletal: She exhibits no edema and no tenderness.  Lymphadenopathy:    She has no cervical adenopathy.  Neurological: She displays normal reflexes. No cranial nerve deficit. She exhibits normal muscle tone. Coordination normal.  Skin: No rash noted. No erythema.  Psychiatric: She has a normal mood and affect. Her behavior is normal. Judgment and thought content normal.       Lab Results  Component Value Date   WBC 7.1 02/14/2012   HGB 13.8 02/14/2012   HCT 40.8 02/14/2012   PLT 257.0 02/14/2012   GLUCOSE 103* 02/14/2012   CHOL 201* 02/14/2012   TRIG 98.0 02/14/2012   HDL 52.10 02/14/2012   LDLDIRECT 138.9 02/14/2012   LDLCALC 108* 08/10/2010   ALT 13 02/14/2012   AST 16 02/14/2012   NA 138 02/14/2012   K 4.2 02/14/2012   CL 102 02/14/2012   CREATININE 0.9 02/14/2012   BUN 20 02/14/2012   CO2 27 02/14/2012   TSH 6.54* 02/14/2012   HGBA1C 5.6 05/23/2006      Assessment & Plan:

## 2012-10-04 ENCOUNTER — Telehealth: Payer: Self-pay | Admitting: *Deleted

## 2012-10-04 DIAGNOSIS — Z0000 Encounter for general adult medical examination without abnormal findings: Secondary | ICD-10-CM

## 2012-10-04 NOTE — Telephone Encounter (Signed)
Message copied by Merrilyn Puma on Thu Oct 04, 2012  1:37 PM ------      Message from: Etheleen Sia      Created: Mon Aug 13, 2012  8:11 AM      Regarding: LAB       PHYSICAL LABS FOR DEC APPT ------

## 2012-10-04 NOTE — Telephone Encounter (Signed)
CPE labs entered.  

## 2012-12-20 ENCOUNTER — Other Ambulatory Visit: Payer: Self-pay

## 2013-01-14 ENCOUNTER — Other Ambulatory Visit: Payer: Self-pay | Admitting: Gynecology

## 2013-01-14 DIAGNOSIS — Z1231 Encounter for screening mammogram for malignant neoplasm of breast: Secondary | ICD-10-CM

## 2013-01-28 ENCOUNTER — Other Ambulatory Visit: Payer: Self-pay | Admitting: Internal Medicine

## 2013-01-28 ENCOUNTER — Ambulatory Visit (INDEPENDENT_AMBULATORY_CARE_PROVIDER_SITE_OTHER): Payer: BC Managed Care – PPO | Admitting: Internal Medicine

## 2013-01-28 ENCOUNTER — Other Ambulatory Visit (INDEPENDENT_AMBULATORY_CARE_PROVIDER_SITE_OTHER): Payer: BC Managed Care – PPO

## 2013-01-28 ENCOUNTER — Encounter: Payer: Self-pay | Admitting: Internal Medicine

## 2013-01-28 VITALS — BP 132/92 | HR 76 | Temp 97.2°F | Resp 16 | Ht 67.0 in | Wt 165.0 lb

## 2013-01-28 DIAGNOSIS — M797 Fibromyalgia: Secondary | ICD-10-CM

## 2013-01-28 DIAGNOSIS — E039 Hypothyroidism, unspecified: Secondary | ICD-10-CM

## 2013-01-28 DIAGNOSIS — Z0000 Encounter for general adult medical examination without abnormal findings: Secondary | ICD-10-CM

## 2013-01-28 DIAGNOSIS — G43809 Other migraine, not intractable, without status migrainosus: Secondary | ICD-10-CM

## 2013-01-28 DIAGNOSIS — Z1231 Encounter for screening mammogram for malignant neoplasm of breast: Secondary | ICD-10-CM

## 2013-01-28 DIAGNOSIS — IMO0001 Reserved for inherently not codable concepts without codable children: Secondary | ICD-10-CM

## 2013-01-28 LAB — HEPATIC FUNCTION PANEL
ALT: 18 U/L (ref 0–35)
Alkaline Phosphatase: 50 U/L (ref 39–117)
Bilirubin, Direct: 0.1 mg/dL (ref 0.0–0.3)
Total Protein: 7.4 g/dL (ref 6.0–8.3)

## 2013-01-28 LAB — CBC WITH DIFFERENTIAL/PLATELET
Basophils Relative: 1.2 % (ref 0.0–3.0)
Eosinophils Absolute: 0.1 10*3/uL (ref 0.0–0.7)
Eosinophils Relative: 1.9 % (ref 0.0–5.0)
Hemoglobin: 13.8 g/dL (ref 12.0–15.0)
Lymphocytes Relative: 23.4 % (ref 12.0–46.0)
Lymphs Abs: 1.6 10*3/uL (ref 0.7–4.0)
Neutrophils Relative %: 65.5 % (ref 43.0–77.0)
Platelets: 251 10*3/uL (ref 150.0–400.0)
RBC: 5.02 Mil/uL (ref 3.87–5.11)
WBC: 6.9 10*3/uL (ref 4.5–10.5)

## 2013-01-28 LAB — URINALYSIS, ROUTINE W REFLEX MICROSCOPIC
Hgb urine dipstick: NEGATIVE
Leukocytes, UA: NEGATIVE
Nitrite: NEGATIVE
RBC / HPF: NONE SEEN (ref 0–?)
Specific Gravity, Urine: 1.02 (ref 1.000–1.030)
Urine Glucose: NEGATIVE
Urobilinogen, UA: 0.2 (ref 0.0–1.0)

## 2013-01-28 LAB — BASIC METABOLIC PANEL
BUN: 15 mg/dL (ref 6–23)
CO2: 29 mEq/L (ref 19–32)
Calcium: 9.1 mg/dL (ref 8.4–10.5)
Creatinine, Ser: 0.8 mg/dL (ref 0.4–1.2)
Glucose, Bld: 104 mg/dL — ABNORMAL HIGH (ref 70–99)
Sodium: 140 mEq/L (ref 135–145)

## 2013-01-28 LAB — LIPID PANEL
Cholesterol: 189 mg/dL (ref 0–200)
HDL: 54.7 mg/dL (ref >39.00)
LDL Cholesterol: 118 mg/dL — ABNORMAL HIGH (ref 0–99)
Total CHOL/HDL Ratio: 3
Triglycerides: 81 mg/dL (ref 0.0–149.0)

## 2013-01-28 MED ORDER — FLUOXETINE HCL 20 MG PO CAPS: 20.0000 mg | ORAL_CAPSULE | Freq: Every day | ORAL | Status: DC

## 2013-01-28 MED ORDER — LEVOTHYROXINE SODIUM 50 MCG PO TABS: 50.0000 ug | ORAL_TABLET | Freq: Every day | ORAL | Status: DC

## 2013-01-28 MED ORDER — TOPIRAMATE 25 MG PO TABS: 25.0000 mg | ORAL_TABLET | Freq: Two times a day (BID) | ORAL | Status: DC

## 2013-01-28 NOTE — Progress Notes (Signed)
Pre visit review using our clinic review tool, if applicable. No additional management support is needed unless otherwise documented below in the visit note. 

## 2013-01-28 NOTE — Assessment & Plan Note (Signed)
Will add Topamax

## 2013-01-28 NOTE — Progress Notes (Signed)
   Subjective:    HPI  The patient is here for a wellness exam. The patient has been doing well overall without major physical or psychological issues going on lately, except for wt gain and HA's. She is on Metoprolol for high BP- better. C/o HAs every day. C/o wt gain... The patient is here to follow up on chronic depression, anxiety, headaches and chronic moderate fibromyalgia symptoms controlled with medicines, diet and exercise. FMS is better on Rx.  In menopause x 8 years. On Combi patch.  BP Readings from Last 3 Encounters:  01/28/13 132/92  08/13/12 120/70  02/14/12 122/82   Wt Readings from Last 3 Encounters:  01/28/13 165 lb (74.844 kg)  08/13/12 159 lb (72.122 kg)  02/14/12 155 lb 4 oz (70.421 kg)     Review of Systems  Constitutional: Negative for chills, activity change, appetite change, fatigue and unexpected weight change.  HENT: Negative for congestion, mouth sores and sinus pressure.   Eyes: Negative for visual disturbance.  Respiratory: Negative for cough and chest tightness.   Gastrointestinal: Negative for nausea and abdominal pain.  Genitourinary: Negative for frequency, difficulty urinating and vaginal pain.  Musculoskeletal: Positive for arthralgias (better). Negative for back pain and gait problem.  Skin: Negative for pallor and rash.  Neurological: Negative for dizziness, tremors, weakness, numbness and headaches.  Psychiatric/Behavioral: Negative for confusion, sleep disturbance and dysphoric mood.       Objective:   Physical Exam  Constitutional: She appears well-developed and well-nourished. No distress.  HENT:  Head: Normocephalic.  Right Ear: External ear normal.  Left Ear: External ear normal.  Nose: Nose normal.  Mouth/Throat: Oropharynx is clear and moist.  Eyes: Conjunctivae are normal. Pupils are equal, round, and reactive to light. Right eye exhibits no discharge. Left eye exhibits no discharge.  Neck: Normal range of motion. Neck  supple. No JVD present. No tracheal deviation present. No thyromegaly present.  Cardiovascular: Normal rate, regular rhythm and normal heart sounds.   Pulmonary/Chest: No stridor. No respiratory distress. She has no wheezes.  Abdominal: Soft. Bowel sounds are normal. She exhibits no distension and no mass. There is no tenderness. There is no rebound and no guarding.  Musculoskeletal: She exhibits no edema and no tenderness.  Lymphadenopathy:    She has no cervical adenopathy.  Neurological: She displays normal reflexes. No cranial nerve deficit. She exhibits normal muscle tone. Coordination normal.  Skin: No rash noted. No erythema.  Psychiatric: She has a normal mood and affect. Her behavior is normal. Judgment and thought content normal.       Lab Results  Component Value Date   WBC 7.1 02/14/2012   HGB 13.8 02/14/2012   HCT 40.8 02/14/2012   PLT 257.0 02/14/2012   GLUCOSE 103* 02/14/2012   CHOL 201* 02/14/2012   TRIG 98.0 02/14/2012   HDL 52.10 02/14/2012   LDLDIRECT 138.9 02/14/2012   LDLCALC 108* 08/10/2010   ALT 13 02/14/2012   AST 16 02/14/2012   NA 138 02/14/2012   K 4.2 02/14/2012   CL 102 02/14/2012   CREATININE 0.9 02/14/2012   BUN 20 02/14/2012   CO2 27 02/14/2012   TSH 6.54* 02/14/2012   HGBA1C 5.6 05/23/2006      Assessment & Plan:

## 2013-01-28 NOTE — Assessment & Plan Note (Signed)
Continue with current prescription therapy as reflected on the Med list.  

## 2013-01-28 NOTE — Assessment & Plan Note (Signed)
We discussed age appropriate health related issues, including available/recomended screening tests and vaccinations. We discussed a need for adhering to healthy diet and exercise. Labs/EKG were reviewed/ordered. All questions were answered.  Vaccines are up up to date Declined colonoscopy

## 2013-01-28 NOTE — Patient Instructions (Addendum)
Zostavax DNA stool test

## 2013-02-01 ENCOUNTER — Encounter: Payer: Self-pay | Admitting: Women's Health

## 2013-02-01 ENCOUNTER — Ambulatory Visit (HOSPITAL_COMMUNITY): Payer: BC Managed Care – PPO

## 2013-02-01 ENCOUNTER — Ambulatory Visit (INDEPENDENT_AMBULATORY_CARE_PROVIDER_SITE_OTHER): Payer: BC Managed Care – PPO | Admitting: Women's Health

## 2013-02-01 ENCOUNTER — Other Ambulatory Visit (HOSPITAL_COMMUNITY)
Admission: RE | Admit: 2013-02-01 | Discharge: 2013-02-01 | Disposition: A | Discharge status: Home / Self Care | Payer: BC Managed Care – PPO | Source: Ambulatory Visit | Attending: Gynecology | Admitting: Gynecology

## 2013-02-01 VITALS — BP 124/80 | Ht 66.5 in | Wt 163.2 lb

## 2013-02-01 DIAGNOSIS — Z01419 Encounter for gynecological examination (general) (routine) without abnormal findings: Secondary | ICD-10-CM | POA: Insufficient documentation

## 2013-02-01 DIAGNOSIS — N959 Unspecified menopausal and perimenopausal disorder: Secondary | ICD-10-CM

## 2013-02-01 MED ORDER — ESTRADIOL-NORETHINDRONE ACET 0.05-0.14 MG/DAY TD PTTW: 1.0000 | MEDICATED_PATCH | TRANSDERMAL | Status: DC

## 2013-02-01 NOTE — Addendum Note (Signed)
Addended by: Richardson Chiquito on: 02/01/2013 03:41 PM   Modules accepted: Orders

## 2013-02-01 NOTE — Progress Notes (Signed)
Jamie Roberts 03-Jul-1955 [id number]    History:    The patient presents for annual exam.  Postmenopausal on CombiPatch with no bleeding. Not sexually active. Normal Pap and mammogram history. Hypertension/hypothyroid/ osteoarthritis/GERD/fibromyalgia primary care manages. Has not had a colonoscopy or DEXA.   Past medical history, past surgical history, family history and social history were all reviewed and documented in the EPIC chart. 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 Son contemplating med school versus PA school.   ROS:  A  ROS was performed and pertinent positives and negatives are included in the history.  Exam:  Filed Vitals:   02/01/13 0824  BP: 124/80    General appearance:  Normal Head/Neck:  Normal, without cervical or supraclavicular adenopathy. Thyroid:  Symmetrical, normal in size, without palpable masses or nodularity. Respiratory  Effort:  Normal  Auscultation:  Clear without wheezing or rhonchi Cardiovascular  Auscultation:  Regular rate, without rubs, murmurs or gallops  Edema/varicosities:  Not grossly evident Abdominal  Soft,nontender, without masses, guarding or rebound.  Liver/spleen:  No organomegaly noted  Hernia:  None appreciated  Skin  Inspection:  Grossly normal  Palpation:  Grossly normal Neurologic/psychiatric  Orientation:  Normal with appropriate conversation.  Mood/affect:  Normal  Genitourinary    Breasts: Examined lying and sitting.     Right: Without masses, retractions, discharge or axillary adenopathy.     Left: Without masses, retractions, discharge or axillary adenopathy.   Inguinal/mons:  Normal without inguinal adenopathy  External genitalia:  Normal  BUS/Urethra/Skene's glands:  Normal  Bladder:  Normal  Vagina:  Normal  Cervix:  Normal  Uterus:   normal in size, shape and contour.  Midline and mobile  Adnexa/parametria:     Rt: Without masses or tenderness.   Lt: Without masses or tenderness.  Anus  and perineum: Normal  Digital rectal exam: Normal sphincter tone without palpated masses or tenderness  Assessment/Plan:  57 y.o. DWF G1P1 for annual exam with no GYN complaints.  Postmenopausal/no bleeding on HRT Hypertension/hypothyroid /GERD/osteoarthritis-primary care manages labs and meds  Plan: CombiPatch .05/.14 prescription, proper use given and reviewed risk for blood clots, strokes, breast cancer. States has numerous hot flushes when off. Will continue. SBE's, continue annual mammogram, 3D tomography reviewed and encouraged history of dense breasts. Continue regular exercise, calcium rich diet, vitamin D 2000 daily. Screening colonoscopy reviewed and encouraged. Screening DEXA reviewed and encouraged. Pap, Pap normal 2012, new screening guidelines reviewed.   Harrington Challenger WHNP, 9:00 AM 02/01/2013

## 2013-02-01 NOTE — Patient Instructions (Signed)
Health Recommendations for Postmenopausal Women Respected and ongoing research has looked at the most common causes of death, disability, and poor quality of life in postmenopausal women. The causes include heart disease, diseases of blood vessels, diabetes, depression, cancer, and bone loss (osteoporosis). Many things can be done to help lower the chances of developing these and other common problems: CARDIOVASCULAR DISEASE Heart Disease: A heart attack is a medical emergency. Know the signs and symptoms of a heart attack. Below are things women can do to reduce their risk for heart disease.   Do not smoke. If you smoke, quit.  Aim for a healthy weight. Being overweight causes many preventable deaths. Eat a healthy and balanced diet and drink an adequate amount of liquids.  Get moving. Make a commitment to be more physically active. Aim for 30 minutes of activity on most, if not all days of the week.  Eat for heart health. Choose a diet that is low in saturated fat and cholesterol and eliminate trans fat. Include whole grains, vegetables, and fruits. Read and understand the labels on food containers before buying.  Know your numbers. Ask your caregiver to check your blood pressure, cholesterol (total, HDL, LDL, triglycerides) and blood glucose. Work with your caregiver on improving your entire clinical picture.  High blood pressure. Limit or stop your table salt intake (try salt substitute and food seasonings). Avoid salty foods and drinks. Read labels on food containers before buying. Eating well and exercising can help control high blood pressure. STROKE  Stroke is a medical emergency. Stroke may be the result of a blood clot in a blood vessel in the brain or by a brain hemorrhage (bleeding). Know the signs and symptoms of a stroke. To lower the risk of developing a stroke:  Avoid fatty foods.  Quit smoking.  Control your diabetes, blood pressure, and irregular heart rate. THROMBOPHLEBITIS  (BLOOD CLOT) OF THE LEG  Becoming overweight and leading a stationary lifestyle may also contribute to developing blood clots. Controlling your diet and exercising will help lower the risk of developing blood clots. CANCER SCREENING  Breast Cancer: Take steps to reduce your risk of breast cancer.  You should practice "breast self-awareness." This means understanding the normal appearance and feel of your breasts and should include breast self-examination. Any changes detected, no matter how small, should be reported to your caregiver.  After age 40, you should have a clinical breast exam (CBE) every year.  Starting at age 40, you should consider having a mammogram (breast X-ray) every year.  If you have a family history of breast cancer, talk to your caregiver about genetic screening.  If you are at high risk for breast cancer, talk to your caregiver about having an MRI and a mammogram every year.  Intestinal or Stomach Cancer: Tests to consider are a rectal exam, fecal occult blood, sigmoidoscopy, and colonoscopy. Women who are high risk may need to be screened at an earlier age and more often.  Cervical Cancer:  Beginning at age 30, you should have a Pap test every 3 years as long as the past 3 Pap tests have been normal.  If you have had past treatment for cervical cancer or a condition that could lead to cancer, you need Pap tests and screening for cancer for at least 20 years after your treatment.  If you had a hysterectomy for a problem that was not cancer or a condition that could lead to cancer, then you no longer need Pap tests.    If you are between ages 65 and 70, and you have had normal Pap tests going back 10 years, you no longer need Pap tests.  If Pap tests have been discontinued, risk factors (such as a new sexual partner) need to be reassessed to determine if screening should be resumed.  Some medical problems can increase the chance of getting cervical cancer. In these  cases, your caregiver may recommend more frequent screening and Pap tests.  Uterine Cancer: If you have vaginal bleeding after reaching menopause, you should notify your caregiver.  Ovarian cancer: Other than yearly pelvic exams, there are no reliable tests available to screen for ovarian cancer at this time except for yearly pelvic exams.  Lung Cancer: Yearly chest X-rays can detect lung cancer and should be done on high risk women, such as cigarette smokers and women with chronic lung disease (emphysema).  Skin Cancer: A complete body skin exam should be done at your yearly examination. Avoid overexposure to the sun and ultraviolet light lamps. Use a strong sun block cream when in the sun. All of these things are important in lowering the risk of skin cancer. MENOPAUSE Menopause Symptoms: Hormone therapy products are effective for treating symptoms associated with menopause:  Moderate to severe hot flashes.  Night sweats.  Mood swings.  Headaches.  Tiredness.  Loss of sex drive.  Insomnia.  Other symptoms. Hormone replacement carries certain risks, especially in older women. Women who use or are thinking about using estrogen or estrogen with progestin treatments should discuss that with their caregiver. Your caregiver will help you understand the benefits and risks. The ideal dose of hormone replacement therapy is not known. The Food and Drug Administration (FDA) has concluded that hormone therapy should be used only at the lowest doses and for the shortest amount of time to reach treatment goals.  OSTEOPOROSIS Protecting Against Bone Loss and Preventing Fracture: If you use hormone therapy for prevention of bone loss (osteoporosis), the risks for bone loss must outweigh the risk of the therapy. Ask your caregiver about other medications known to be safe and effective for preventing bone loss and fractures. To guard against bone loss or fractures, the following is recommended:  If  you are less than age 50, take 1000 mg of calcium and at least 600 mg of Vitamin D per day.  If you are greater than age 50 but less than age 70, take 1200 mg of calcium and at least 600 mg of Vitamin D per day.  If you are greater than age 70, take 1200 mg of calcium and at least 800 mg of Vitamin D per day. Smoking and excessive alcohol intake increases the risk of osteoporosis. Eat foods rich in calcium and vitamin D and do weight bearing exercises several times a week as your caregiver suggests. DIABETES Diabetes Melitus: If you have Type I or Type 2 diabetes, you should keep your blood sugar under control with diet, exercise and recommended medication. Avoid too many sweets, starchy and fatty foods. Being overweight can make control more difficult. COGNITION AND MEMORY Cognition and Memory: Menopausal hormone therapy is not recommended for the prevention of cognitive disorders such as Alzheimer's disease or memory loss.  DEPRESSION  Depression may occur at any age, but is common in elderly women. The reasons may be because of physical, medical, social (loneliness), or financial problems and needs. If you are experiencing depression because of medical problems and control of symptoms, talk to your caregiver about this. Physical activity and   exercise may help with mood and sleep. Community and volunteer involvement may help your sense of value and worth. If you have depression and you feel that the problem is getting worse or becoming severe, talk to your caregiver about treatment options that are best for you. ACCIDENTS  Accidents are common and can be serious in the elderly woman. Prepare your house to prevent accidents. Eliminate throw rugs, place hand bars in the bath, shower and toilet areas. Avoid wearing high heeled shoes or walking on wet, snowy, and icy areas. Limit or stop driving if you have vision or hearing problems, or you feel you are unsteady with you movements and  reflexes. HEPATITIS C Hepatitis C is a type of viral infection affecting the liver. It is spread mainly through contact with blood from an infected person. It can be treated, but if left untreated, it can lead to severe liver damage over years. Many people who are infected do not know that the virus is in their blood. If you are a "baby-boomer", it is recommended that you have one screening test for Hepatitis C. IMMUNIZATIONS  Several immunizations are important to consider having during your senior years, including:   Tetanus, diptheria, and pertussis booster shot.  Influenza every year before the flu season begins.  Pneumonia vaccine.  Shingles vaccine.  Others as indicated based on your specific needs. Talk to your caregiver about these. Document Released: 03/25/2005 Document Revised: 01/18/2012 Document Reviewed: 11/19/2007 [employer] Patient Information 2014 [employer].  

## 2013-02-05 ENCOUNTER — Ambulatory Visit (HOSPITAL_COMMUNITY)
Admission: RE | Admit: 2013-02-05 | Discharge: 2013-02-05 | Disposition: A | Discharge status: Home / Self Care | Payer: BC Managed Care – PPO | Source: Ambulatory Visit | Attending: Gynecology | Admitting: Gynecology

## 2013-02-05 DIAGNOSIS — Z1231 Encounter for screening mammogram for malignant neoplasm of breast: Secondary | ICD-10-CM | POA: Insufficient documentation

## 2013-02-18 ENCOUNTER — Encounter: Payer: Self-pay | Admitting: Internal Medicine

## 2013-02-18 ENCOUNTER — Ambulatory Visit (INDEPENDENT_AMBULATORY_CARE_PROVIDER_SITE_OTHER): Payer: BC Managed Care – PPO | Admitting: Internal Medicine

## 2013-02-18 VITALS — BP 160/100 | HR 88 | Temp 97.6°F | Resp 16 | Wt 160.0 lb

## 2013-02-18 DIAGNOSIS — B029 Zoster without complications: Secondary | ICD-10-CM

## 2013-02-18 NOTE — Assessment & Plan Note (Addendum)
1/15 R chest Off work 1/3-4/15

## 2013-02-18 NOTE — Progress Notes (Signed)
Pre visit review using our clinic review tool, if applicable. No additional management support is needed unless otherwise documented below in the visit note. 

## 2013-02-18 NOTE — Progress Notes (Signed)
   Subjective:    HPI  C/o R chest shingles x 1 wk - just finished 7 d Valtrex  BP Readings from Last 3 Encounters:  02/18/13 160/100  02/01/13 124/80  01/28/13 132/92   Wt Readings from Last 3 Encounters:  02/18/13 160 lb (72.576 kg)  02/01/13 163 lb 3.2 oz (74.027 kg)  01/28/13 165 lb (74.844 kg)     Review of Systems  Constitutional: Negative for chills, activity change, appetite change, fatigue and unexpected weight change.  HENT: Negative for congestion, mouth sores and sinus pressure.   Eyes: Negative for visual disturbance.  Respiratory: Negative for cough and chest tightness.   Gastrointestinal: Negative for nausea and abdominal pain.  Genitourinary: Negative for frequency, difficulty urinating and vaginal pain.  Musculoskeletal: Positive for arthralgias (better). Negative for back pain and gait problem.  Skin: Negative for pallor and rash.  Neurological: Negative for dizziness, tremors, weakness, numbness and headaches.  Psychiatric/Behavioral: Negative for confusion, sleep disturbance and dysphoric mood.       Objective:   Physical Exam  Constitutional: She appears well-developed and well-nourished. No distress.  HENT:  Head: Normocephalic.  Right Ear: External ear normal.  Left Ear: External ear normal.  Nose: Nose normal.  Mouth/Throat: Oropharynx is clear and moist.  Eyes: Conjunctivae are normal. Pupils are equal, round, and reactive to light. Right eye exhibits no discharge. Left eye exhibits no discharge.  Neck: Normal range of motion. Neck supple. No JVD present. No tracheal deviation present. No thyromegaly present.  Cardiovascular: Normal rate, regular rhythm and normal heart sounds.   Pulmonary/Chest: No stridor. No respiratory distress. She has no wheezes.  Abdominal: Soft. Bowel sounds are normal. She exhibits no distension and no mass. There is no tenderness. There is no rebound and no guarding.  Musculoskeletal: She exhibits no edema and no  tenderness.  Lymphadenopathy:    She has no cervical adenopathy.  Neurological: She displays normal reflexes. No cranial nerve deficit. She exhibits normal muscle tone. Coordination normal.  Skin: No rash noted. No erythema.  Psychiatric: She has a normal mood and affect. Her behavior is normal. Judgment and thought content normal.  R ribs post and anter chest rash - drying      Lab Results  Component Value Date   WBC 6.9 01/28/2013   HGB 13.8 01/28/2013   HCT 41.2 01/28/2013   PLT 251.0 01/28/2013   GLUCOSE 104* 01/28/2013   CHOL 189 01/28/2013   TRIG 81.0 01/28/2013   HDL 54.70 01/28/2013   LDLDIRECT 138.9 02/14/2012   LDLCALC 118* 01/28/2013   ALT 18 01/28/2013   AST 18 01/28/2013   NA 140 01/28/2013   K 4.0 01/28/2013   CL 104 01/28/2013   CREATININE 0.8 01/28/2013   BUN 15 01/28/2013   CO2 29 01/28/2013   TSH 3.18 01/28/2013   HGBA1C 5.6 05/23/2006      Assessment & Plan:

## 2013-03-26 ENCOUNTER — Other Ambulatory Visit: Payer: Self-pay | Admitting: *Deleted

## 2013-03-26 MED ORDER — TOPIRAMATE 25 MG PO TABS: 25.0000 mg | ORAL_TABLET | Freq: Two times a day (BID) | ORAL | Status: DC

## 2013-04-29 ENCOUNTER — Ambulatory Visit (INDEPENDENT_AMBULATORY_CARE_PROVIDER_SITE_OTHER): Payer: BC Managed Care – PPO | Admitting: Internal Medicine

## 2013-04-29 ENCOUNTER — Encounter: Payer: Self-pay | Admitting: Internal Medicine

## 2013-04-29 VITALS — BP 140/94 | HR 72 | Temp 96.5°F | Resp 16 | Wt 153.0 lb

## 2013-04-29 DIAGNOSIS — F329 Major depressive disorder, single episode, unspecified: Secondary | ICD-10-CM

## 2013-04-29 DIAGNOSIS — M797 Fibromyalgia: Secondary | ICD-10-CM

## 2013-04-29 DIAGNOSIS — G43809 Other migraine, not intractable, without status migrainosus: Secondary | ICD-10-CM

## 2013-04-29 DIAGNOSIS — F3289 Other specified depressive episodes: Secondary | ICD-10-CM

## 2013-04-29 DIAGNOSIS — IMO0001 Reserved for inherently not codable concepts without codable children: Secondary | ICD-10-CM

## 2013-04-29 NOTE — Assessment & Plan Note (Signed)
Continue with current prescription therapy as reflected on the Med list.  

## 2013-04-29 NOTE — Progress Notes (Signed)
Pre visit review using our clinic review tool, if applicable. No additional management support is needed unless otherwise documented below in the visit note. 

## 2013-04-29 NOTE — Assessment & Plan Note (Signed)
Continue with current prescription therapy as reflected on the Med list. On Topamax - doing better

## 2013-06-25 ENCOUNTER — Encounter: Payer: Self-pay | Admitting: Internal Medicine

## 2013-06-26 ENCOUNTER — Other Ambulatory Visit: Payer: Self-pay | Admitting: Internal Medicine

## 2013-06-26 MED ORDER — TOPIRAMATE 50 MG PO TABS: 50.0000 mg | ORAL_TABLET | Freq: Two times a day (BID) | ORAL | Status: DC

## 2013-08-02 ENCOUNTER — Other Ambulatory Visit: Payer: Self-pay | Admitting: Internal Medicine

## 2013-08-15 ENCOUNTER — Other Ambulatory Visit: Payer: Self-pay | Admitting: *Deleted

## 2013-08-15 MED ORDER — TOPIRAMATE 50 MG PO TABS: 50.0000 mg | ORAL_TABLET | Freq: Two times a day (BID) | ORAL | Status: DC

## 2013-12-16 ENCOUNTER — Encounter: Payer: Self-pay | Admitting: Internal Medicine

## 2014-01-01 ENCOUNTER — Ambulatory Visit: Payer: Self-pay | Admitting: Family

## 2014-01-13 ENCOUNTER — Other Ambulatory Visit: Payer: Self-pay | Admitting: Neurosurgery

## 2014-01-13 DIAGNOSIS — M5417 Radiculopathy, lumbosacral region: Secondary | ICD-10-CM

## 2014-01-14 HISTORY — PX: OTHER SURGICAL HISTORY: SHX169

## 2014-01-15 ENCOUNTER — Ambulatory Visit
Admission: RE | Admit: 2014-01-15 | Discharge: 2014-01-15 | Disposition: A | Discharge status: Home / Self Care | Payer: BC Managed Care – PPO | Source: Ambulatory Visit | Attending: Neurosurgery | Admitting: Neurosurgery

## 2014-01-15 DIAGNOSIS — M5417 Radiculopathy, lumbosacral region: Secondary | ICD-10-CM

## 2014-01-15 MED ORDER — METHYLPREDNISOLONE ACETATE 40 MG/ML INJ SUSP (RADIOLOG
120.0000 mg | Freq: Once | INTRAMUSCULAR | Status: AC
  Administered 2014-01-15: 120 mg via EPIDURAL

## 2014-01-15 MED ORDER — IOHEXOL 180 MG/ML  SOLN
1.0000 mL | Freq: Once | INTRAMUSCULAR | Status: AC | PRN
  Administered 2014-01-15: 1 mL via EPIDURAL

## 2014-01-15 NOTE — Discharge Instructions (Signed)

## 2014-01-17 ENCOUNTER — Other Ambulatory Visit: Payer: Self-pay | Admitting: Internal Medicine

## 2014-02-03 ENCOUNTER — Encounter: Payer: BC Managed Care – PPO | Admitting: Internal Medicine

## 2014-02-12 ENCOUNTER — Encounter: Payer: Self-pay | Admitting: Internal Medicine

## 2014-02-12 ENCOUNTER — Ambulatory Visit (INDEPENDENT_AMBULATORY_CARE_PROVIDER_SITE_OTHER): Payer: BC Managed Care – PPO | Admitting: Internal Medicine

## 2014-02-12 ENCOUNTER — Other Ambulatory Visit (INDEPENDENT_AMBULATORY_CARE_PROVIDER_SITE_OTHER): Payer: BC Managed Care – PPO

## 2014-02-12 VITALS — BP 140/80 | HR 75 | Temp 98.0°F | Ht 66.5 in | Wt 139.0 lb

## 2014-02-12 DIAGNOSIS — M5417 Radiculopathy, lumbosacral region: Secondary | ICD-10-CM

## 2014-02-12 DIAGNOSIS — F329 Major depressive disorder, single episode, unspecified: Secondary | ICD-10-CM

## 2014-02-12 DIAGNOSIS — F32A Depression, unspecified: Secondary | ICD-10-CM

## 2014-02-12 DIAGNOSIS — Z0000 Encounter for general adult medical examination without abnormal findings: Secondary | ICD-10-CM

## 2014-02-12 DIAGNOSIS — M5416 Radiculopathy, lumbar region: Secondary | ICD-10-CM

## 2014-02-12 LAB — URINALYSIS
Bilirubin Urine: NEGATIVE
Hgb urine dipstick: NEGATIVE
Ketones, ur: NEGATIVE
Leukocytes, UA: NEGATIVE
NITRITE: NEGATIVE
PH: 7 (ref 5.0–8.0)
Specific Gravity, Urine: 1.01 (ref 1.000–1.030)
Total Protein, Urine: NEGATIVE
URINE GLUCOSE: NEGATIVE
Urobilinogen, UA: 0.2 (ref 0.0–1.0)

## 2014-02-12 LAB — HEPATIC FUNCTION PANEL
ALBUMIN: 4.3 g/dL (ref 3.5–5.2)
ALK PHOS: 46 U/L (ref 39–117)
ALT: 11 U/L (ref 0–35)
AST: 15 U/L (ref 0–37)
Bilirubin, Direct: 0 mg/dL (ref 0.0–0.3)
TOTAL PROTEIN: 7.4 g/dL (ref 6.0–8.3)
Total Bilirubin: 0.7 mg/dL (ref 0.2–1.2)

## 2014-02-12 LAB — CBC WITH DIFFERENTIAL/PLATELET
BASOS PCT: 0.5 % (ref 0.0–3.0)
Basophils Absolute: 0 10*3/uL (ref 0.0–0.1)
EOS ABS: 0.2 10*3/uL (ref 0.0–0.7)
Eosinophils Relative: 3.3 % (ref 0.0–5.0)
HCT: 42.5 % (ref 36.0–46.0)
Hemoglobin: 14 g/dL (ref 12.0–15.0)
LYMPHS PCT: 26.2 % (ref 12.0–46.0)
Lymphs Abs: 1.9 10*3/uL (ref 0.7–4.0)
MCHC: 33 g/dL (ref 30.0–36.0)
MCV: 84.4 fl (ref 78.0–100.0)
Monocytes Absolute: 0.6 10*3/uL (ref 0.1–1.0)
Monocytes Relative: 8.3 % (ref 3.0–12.0)
NEUTROS PCT: 61.7 % (ref 43.0–77.0)
Neutro Abs: 4.4 10*3/uL (ref 1.4–7.7)
Platelets: 217 10*3/uL (ref 150.0–400.0)
RBC: 5.04 Mil/uL (ref 3.87–5.11)
RDW: 14.8 % (ref 11.5–15.5)
WBC: 7.2 10*3/uL (ref 4.0–10.5)

## 2014-02-12 LAB — LIPID PANEL
Cholesterol: 207 mg/dL — ABNORMAL HIGH (ref 0–200)
HDL: 58.4 mg/dL (ref >39.00)
LDL Cholesterol: 126 mg/dL — ABNORMAL HIGH (ref 0–99)
NonHDL: 148.6
TRIGLYCERIDES: 114 mg/dL (ref 0.0–149.0)
Total CHOL/HDL Ratio: 4
VLDL: 22.8 mg/dL (ref 0.0–40.0)

## 2014-02-12 LAB — BASIC METABOLIC PANEL
BUN: 15 mg/dL (ref 6–23)
CALCIUM: 9.4 mg/dL (ref 8.4–10.5)
CO2: 26 mEq/L (ref 19–32)
Chloride: 107 mEq/L (ref 96–112)
Creatinine, Ser: 0.7 mg/dL (ref 0.4–1.2)
GFR: 87.32 mL/min (ref >60.00)
GLUCOSE: 92 mg/dL (ref 70–99)
Potassium: 4.2 mEq/L (ref 3.5–5.1)
SODIUM: 140 meq/L (ref 135–145)

## 2014-02-12 LAB — TSH: TSH: 5.11 u[IU]/mL — AB (ref 0.35–4.50)

## 2014-02-12 MED ORDER — TOPIRAMATE 50 MG PO TABS: 50.0000 mg | ORAL_TABLET | Freq: Two times a day (BID) | ORAL | Status: DC

## 2014-02-12 MED ORDER — LEVOTHYROXINE SODIUM 50 MCG PO TABS: 50.0000 ug | ORAL_TABLET | Freq: Every day | ORAL | Status: DC

## 2014-02-12 MED ORDER — SUMATRIPTAN SUCCINATE 100 MG PO TABS: ORAL_TABLET | ORAL | Status: DC

## 2014-02-12 MED ORDER — FLUOXETINE HCL 20 MG PO CAPS: 20.0000 mg | ORAL_CAPSULE | Freq: Every day | ORAL | Status: DC

## 2014-02-12 NOTE — Progress Notes (Signed)
   Subjective:    HPI  The patient is here for a wellness exam. 01/17/14 microdiskectomy Dr Joya Salm Left L4-5. She is on Metoprolol for high BP- better. C/o HAs every day. C/o wt gain... The patient is here to follow up on chronic depression, anxiety, headaches and chronic moderate fibromyalgia symptoms controlled with medicines, diet and exercise. BP is nl at home  In menopause x 9 years. On Combi patch.  BP Readings from Last 3 Encounters:  02/12/14 140/80  01/15/14 153/71  04/29/13 140/94   Wt Readings from Last 3 Encounters:  02/12/14 139 lb (63.05 kg)  04/29/13 153 lb (69.4 kg)  02/18/13 160 lb (72.576 kg)     Review of Systems  Constitutional: Negative for chills, activity change, appetite change, fatigue and unexpected weight change.  HENT: Negative for congestion, mouth sores and sinus pressure.   Eyes: Negative for visual disturbance.  Respiratory: Negative for cough and chest tightness.   Gastrointestinal: Negative for nausea and abdominal pain.  Genitourinary: Negative for frequency, difficulty urinating and vaginal pain.  Musculoskeletal: Positive for arthralgias (better). Negative for back pain and gait problem.  Skin: Negative for pallor and rash.  Neurological: Negative for dizziness, tremors, weakness, numbness and headaches.  Psychiatric/Behavioral: Negative for confusion, sleep disturbance and dysphoric mood.       Objective:   Physical Exam  Constitutional: She appears well-developed. No distress.  HENT:  Head: Normocephalic.  Right Ear: External ear normal.  Left Ear: External ear normal.  Nose: Nose normal.  Mouth/Throat: Oropharynx is clear and moist.  Eyes: Conjunctivae are normal. Pupils are equal, round, and reactive to light. Right eye exhibits no discharge. Left eye exhibits no discharge.  Neck: Normal range of motion. Neck supple. No JVD present. No tracheal deviation present. No thyromegaly present.  Cardiovascular: Normal rate, regular  rhythm and normal heart sounds.   Pulmonary/Chest: No stridor. No respiratory distress. She has no wheezes.  Abdominal: Soft. Bowel sounds are normal. She exhibits no distension and no mass. There is no tenderness. There is no rebound and no guarding.  Musculoskeletal: She exhibits no edema or tenderness.  Lymphadenopathy:    She has no cervical adenopathy.  Neurological: She displays normal reflexes. No cranial nerve deficit. She exhibits normal muscle tone. Coordination normal.  Skin: No rash noted. No erythema.  Psychiatric: She has a normal mood and affect. Her behavior is normal. Judgment and thought content normal.  LS is tender w/ROM post-op     Lab Results  Component Value Date   WBC 6.9 01/28/2013   HGB 13.8 01/28/2013   HCT 41.2 01/28/2013   PLT 251.0 01/28/2013   GLUCOSE 104* 01/28/2013   CHOL 189 01/28/2013   TRIG 81.0 01/28/2013   HDL 54.70 01/28/2013   LDLDIRECT 138.9 02/14/2012   LDLCALC 118* 01/28/2013   ALT 18 01/28/2013   AST 18 01/28/2013   NA 140 01/28/2013   K 4.0 01/28/2013   CL 104 01/28/2013   CREATININE 0.8 01/28/2013   BUN 15 01/28/2013   CO2 29 01/28/2013   TSH 3.18 01/28/2013   HGBA1C 5.6 05/23/2006      Assessment & Plan:

## 2014-02-12 NOTE — Assessment & Plan Note (Signed)
Continue with current prescription therapy as reflected on the Med list.  

## 2014-02-12 NOTE — Assessment & Plan Note (Signed)
01/17/14 microdiskectomy Dr Joya Salm Left L4-5.

## 2014-02-12 NOTE — Assessment & Plan Note (Addendum)
We discussed age appropriate health related issues, including available/recomended screening tests and vaccinations. We discussed a need for adhering to healthy diet and exercise. Labs/EKG were reviewed/ordered. All questions were answered. tDAP due in 2016

## 2014-03-10 ENCOUNTER — Other Ambulatory Visit: Payer: Self-pay | Admitting: Internal Medicine

## 2014-03-10 ENCOUNTER — Telehealth: Payer: Self-pay | Admitting: Internal Medicine

## 2014-03-10 MED ORDER — FLUOXETINE HCL 20 MG PO CAPS: 20.0000 mg | ORAL_CAPSULE | Freq: Every day | ORAL | Status: DC

## 2014-03-10 NOTE — Telephone Encounter (Signed)
Not[facility]

## 2014-03-10 NOTE — Telephone Encounter (Signed)
Pt called in for refill on her FLUoxetine (PROZAC) 20 MG capsule [[id number]]  Last seen 02/12/2014 for CPE  Pharmacy[facility]

## 2014-03-24 ENCOUNTER — Other Ambulatory Visit (INDEPENDENT_AMBULATORY_CARE_PROVIDER_SITE_OTHER): Payer: Self-pay

## 2014-03-24 ENCOUNTER — Other Ambulatory Visit: Payer: Self-pay | Admitting: *Deleted

## 2014-03-24 DIAGNOSIS — R7989 Other specified abnormal findings of blood chemistry: Secondary | ICD-10-CM

## 2014-03-24 LAB — TSH: TSH: 2.58 u[IU]/mL (ref 0.35–4.50)

## 2014-03-24 LAB — T4, FREE: FREE T4: 0.92 ng/dL (ref 0.60–1.60)

## 2014-04-11 ENCOUNTER — Other Ambulatory Visit: Payer: Self-pay | Admitting: Internal Medicine

## 2014-04-14 ENCOUNTER — Other Ambulatory Visit: Payer: Self-pay | Admitting: Internal Medicine

## 2014-06-30 ENCOUNTER — Other Ambulatory Visit: Payer: Self-pay | Admitting: Internal Medicine

## 2014-07-10 ENCOUNTER — Other Ambulatory Visit: Payer: Self-pay | Admitting: Women's Health

## 2014-07-10 DIAGNOSIS — Z1231 Encounter for screening mammogram for malignant neoplasm of breast: Secondary | ICD-10-CM

## 2014-07-11 ENCOUNTER — Ambulatory Visit (HOSPITAL_COMMUNITY)
Admission: RE | Admit: 2014-07-11 | Discharge: 2014-07-11 | Disposition: A | Discharge status: Home / Self Care | Payer: BLUE CROSS/BLUE SHIELD | Source: Ambulatory Visit | Attending: Women's Health | Admitting: Women's Health

## 2014-07-11 DIAGNOSIS — Z1231 Encounter for screening mammogram for malignant neoplasm of breast: Secondary | ICD-10-CM

## 2014-07-11 LAB — HM MAMMOGRAPHY

## 2014-07-30 ENCOUNTER — Ambulatory Visit (INDEPENDENT_AMBULATORY_CARE_PROVIDER_SITE_OTHER): Payer: BLUE CROSS/BLUE SHIELD | Admitting: Women's Health

## 2014-07-30 ENCOUNTER — Encounter: Payer: Self-pay | Admitting: Women's Health

## 2014-07-30 ENCOUNTER — Other Ambulatory Visit (HOSPITAL_COMMUNITY)
Admission: RE | Admit: 2014-07-30 | Discharge: 2014-07-30 | Disposition: A | Discharge status: Home / Self Care | Payer: BLUE CROSS/BLUE SHIELD | Source: Ambulatory Visit | Attending: Women's Health | Admitting: Women's Health

## 2014-07-30 VITALS — BP 140/85 | Ht 66.0 in | Wt 142.8 lb

## 2014-07-30 DIAGNOSIS — Z01419 Encounter for gynecological examination (general) (routine) without abnormal findings: Secondary | ICD-10-CM

## 2014-07-30 DIAGNOSIS — Z01411 Encounter for gynecological examination (general) (routine) with abnormal findings: Secondary | ICD-10-CM | POA: Diagnosis present

## 2014-07-30 DIAGNOSIS — Z1151 Encounter for screening for human papillomavirus (HPV): Secondary | ICD-10-CM | POA: Insufficient documentation

## 2014-07-30 LAB — HM PAP SMEAR

## 2014-07-30 NOTE — Patient Instructions (Signed)

## 2014-07-30 NOTE — Progress Notes (Signed)
Jamie Roberts 1955/12/20 [id number]    History:    Presents for annual exam.  Postmenopausal/no bleeding, CombiPatch has stopped. Not sexually active in years Hypothyroid/GERD primary care manages. Normal Pap and mammogram history. Insurance does not cover a bone density/declines. Has not had a colonoscopy. Reports normal labs at primary care. Reports blood pressure elevated at doctor's office but when checked away 120s over 70s. 2016 back surgery.  Past medical history, past surgical history, family history and social history were all reviewed and documented in the EPIC chart. RN at [employer], recently completed  BSN. Had been in M.D. in [place]. Son currently in nursing school doing well.  ROS:  A ROS was performed and pertinent positives and negatives are included.  Exam:  Filed Vitals:   07/30/14 1501  BP: 140/85    General appearance:  Normal Thyroid:  Symmetrical, normal in size, without palpable masses or nodularity. Respiratory  Auscultation:  Clear without wheezing or rhonchi Cardiovascular  Auscultation:  Regular rate, without rubs, murmurs or gallops  Edema/varicosities:  Not grossly evident Abdominal  Soft,nontender, without masses, guarding or rebound.  Liver/spleen:  No organomegaly noted  Hernia:  None appreciated  Skin  Inspection:  Grossly normal   Breasts: Examined lying and sitting.     Right: Without masses, retractions, discharge or axillary adenopathy.     Left: Without masses, retractions, discharge or axillary adenopathy. Gentitourinary   Inguinal/mons:  Normal without inguinal adenopathy  External genitalia:  Normal  BUS/Urethra/Skene's glands:  Normal  Vagina:  Normal  Cervix:  Normal  Uterus:   normal in size, shape and contour.  Midline and mobile  Adnexa/parametria:     Rt: Without masses or tenderness.   Lt: Without masses or tenderness.  Anus and perineum: Normal  Digital rectal exam: Normal sphincter tone without palpated masses or  tenderness  Assessment/Plan:  59 y.o. DWF G1P1 for annual exam with no complaints.  Postmenopausal/no bleeding/recently stopped HRT Hypothyroid/GERD/questionable hypertension depression-primary care manages labs and meds Overdue for colonoscopy  Plan: Instructed to schedule screening colonoscopy, SBE's, annual screening mammogram, 3-D tomography reviewed and encouraged history of dense breasts. Continue healthy lifestyle of exercise and diet. UA, Pap normal 2014, Pap with HR HPV typing new screening guidelines reviewed. Condoms encouraged if sexually active. Continue to monitor blood pressure      Huel Cote WHNP, 4:40 PM 07/30/2014

## 2014-07-31 ENCOUNTER — Encounter: Payer: Self-pay | Admitting: Internal Medicine

## 2014-08-01 ENCOUNTER — Other Ambulatory Visit: Payer: Self-pay | Admitting: Internal Medicine

## 2014-08-01 LAB — CYTOLOGY - PAP

## 2014-08-01 MED ORDER — TOPIRAMATE 100 MG PO TABS: 100.0000 mg | ORAL_TABLET | Freq: Two times a day (BID) | ORAL | Status: DC

## 2014-08-05 ENCOUNTER — Other Ambulatory Visit: Payer: Self-pay | Admitting: Women's Health

## 2014-08-05 DIAGNOSIS — R87618 Other abnormal cytological findings on specimens from cervix uteri: Secondary | ICD-10-CM

## 2014-08-11 ENCOUNTER — Encounter: Payer: Self-pay | Admitting: Internal Medicine

## 2014-08-11 ENCOUNTER — Ambulatory Visit (INDEPENDENT_AMBULATORY_CARE_PROVIDER_SITE_OTHER): Payer: BLUE CROSS/BLUE SHIELD | Admitting: Internal Medicine

## 2014-08-11 ENCOUNTER — Other Ambulatory Visit (INDEPENDENT_AMBULATORY_CARE_PROVIDER_SITE_OTHER): Payer: BLUE CROSS/BLUE SHIELD

## 2014-08-11 VITALS — BP 122/80 | HR 75 | Ht 67.0 in | Wt 143.0 lb

## 2014-08-11 DIAGNOSIS — Z0000 Encounter for general adult medical examination without abnormal findings: Secondary | ICD-10-CM | POA: Diagnosis not present

## 2014-08-11 DIAGNOSIS — Z1211 Encounter for screening for malignant neoplasm of colon: Secondary | ICD-10-CM

## 2014-08-11 LAB — CBC WITH DIFFERENTIAL/PLATELET
BASOS PCT: 0.4 % (ref 0.0–3.0)
Basophils Absolute: 0 10*3/uL (ref 0.0–0.1)
Eosinophils Absolute: 0.1 10*3/uL (ref 0.0–0.7)
Eosinophils Relative: 2 % (ref 0.0–5.0)
HCT: 40.3 % (ref 36.0–46.0)
HEMOGLOBIN: 13.5 g/dL (ref 12.0–15.0)
LYMPHS PCT: 19.5 % (ref 12.0–46.0)
Lymphs Abs: 1.4 10*3/uL (ref 0.7–4.0)
MCHC: 33.5 g/dL (ref 30.0–36.0)
MCV: 82.7 fl (ref 78.0–100.0)
Monocytes Absolute: 0.6 10*3/uL (ref 0.1–1.0)
Monocytes Relative: 9.1 % (ref 3.0–12.0)
NEUTROS ABS: 4.8 10*3/uL (ref 1.4–7.7)
NEUTROS PCT: 69 % (ref 43.0–77.0)
Platelets: 239 10*3/uL (ref 150.0–400.0)
RBC: 4.88 Mil/uL (ref 3.87–5.11)
RDW: 15.1 % (ref 11.5–15.5)
WBC: 7 10*3/uL (ref 4.0–10.5)

## 2014-08-11 LAB — URINALYSIS
Bilirubin Urine: NEGATIVE
HGB URINE DIPSTICK: NEGATIVE
Ketones, ur: NEGATIVE
LEUKOCYTES UA: NEGATIVE
Nitrite: NEGATIVE
Specific Gravity, Urine: 1.015 (ref 1.000–1.030)
Total Protein, Urine: NEGATIVE
URINE GLUCOSE: NEGATIVE
Urobilinogen, UA: 0.2 (ref 0.0–1.0)
pH: 7 (ref 5.0–8.0)

## 2014-08-11 LAB — IBC PANEL
IRON: 125 ug/dL (ref 42–145)
SATURATION RATIOS: 31.1 % (ref 20.0–50.0)
TRANSFERRIN: 287 mg/dL (ref 212.0–360.0)

## 2014-08-11 LAB — HEPATIC FUNCTION PANEL
ALBUMIN: 4.1 g/dL (ref 3.5–5.2)
ALT: 10 U/L (ref 0–35)
AST: 13 U/L (ref 0–37)
Alkaline Phosphatase: 55 U/L (ref 39–117)
BILIRUBIN TOTAL: 0.5 mg/dL (ref 0.2–1.2)
Bilirubin, Direct: 0 mg/dL (ref 0.0–0.3)
Total Protein: 7.3 g/dL (ref 6.0–8.3)

## 2014-08-11 LAB — LIPID PANEL
CHOL/HDL RATIO: 3
Cholesterol: 174 mg/dL (ref 0–200)
HDL: 53 mg/dL (ref >39.00)
LDL Cholesterol: 105 mg/dL — ABNORMAL HIGH (ref 0–99)
NONHDL: 121
Triglycerides: 81 mg/dL (ref 0.0–149.0)
VLDL: 16.2 mg/dL (ref 0.0–40.0)

## 2014-08-11 LAB — TSH: TSH: 4.43 u[IU]/mL (ref 0.35–4.50)

## 2014-08-11 LAB — BASIC METABOLIC PANEL
BUN: 14 mg/dL (ref 6–23)
CHLORIDE: 110 meq/L (ref 96–112)
CO2: 23 meq/L (ref 19–32)
CREATININE: 0.85 mg/dL (ref 0.40–1.20)
Calcium: 9.3 mg/dL (ref 8.4–10.5)
GFR: 73.13 mL/min (ref >60.00)
GLUCOSE: 87 mg/dL (ref 70–99)
POTASSIUM: 3.8 meq/L (ref 3.5–5.1)
Sodium: 142 mEq/L (ref 135–145)

## 2014-08-11 NOTE — Progress Notes (Signed)
   Subjective:    HPI  The patient is here for a wellness exam.   01/17/14 microdiskectomy Dr Joya Salm Left L4-5. She is on Metoprolol for high BP- better. C/o HAs every day. C/o wt gain... The patient is here to follow up on chronic depression, anxiety, headaches and chronic moderate fibromyalgia symptoms controlled with medicines, diet and exercise. BP is nl at home  In menopause x 9 years. On Combi patch.  BP Readings from Last 3 Encounters:  08/11/14 122/80  07/30/14 140/85  02/12/14 140/80   Wt Readings from Last 3 Encounters:  08/11/14 143 lb (64.864 kg)  07/30/14 142 lb 12.8 oz (64.774 kg)  02/12/14 139 lb (63.05 kg)     Review of Systems  Constitutional: Negative for chills, activity change, appetite change, fatigue and unexpected weight change.  HENT: Negative for congestion, mouth sores and sinus pressure.   Eyes: Negative for visual disturbance.  Respiratory: Negative for cough and chest tightness.   Gastrointestinal: Negative for nausea and abdominal pain.  Genitourinary: Negative for frequency, difficulty urinating and vaginal pain.  Musculoskeletal: Positive for arthralgias (better). Negative for back pain and gait problem.  Skin: Negative for pallor and rash.  Neurological: Negative for dizziness, tremors, weakness, numbness and headaches.  Psychiatric/Behavioral: Negative for confusion, sleep disturbance and dysphoric mood.       Objective:   Physical Exam  Constitutional: She appears well-developed. No distress.  HENT:  Head: Normocephalic.  Right Ear: External ear normal.  Left Ear: External ear normal.  Nose: Nose normal.  Mouth/Throat: Oropharynx is clear and moist.  Eyes: Conjunctivae are normal. Pupils are equal, round, and reactive to light. Right eye exhibits no discharge. Left eye exhibits no discharge.  Neck: Normal range of motion. Neck supple. No JVD present. No tracheal deviation present. No thyromegaly present.  Cardiovascular: Normal  rate, regular rhythm and normal heart sounds.   Pulmonary/Chest: No stridor. No respiratory distress. She has no wheezes.  Abdominal: Soft. Bowel sounds are normal. She exhibits no distension and no mass. There is no tenderness. There is no rebound and no guarding.  Musculoskeletal: She exhibits no edema or tenderness.  Lymphadenopathy:    She has no cervical adenopathy.  Neurological: She displays normal reflexes. No cranial nerve deficit. She exhibits normal muscle tone. Coordination normal.  Skin: No rash noted. No erythema.  Psychiatric: She has a normal mood and affect. Her behavior is normal. Judgment and thought content normal.  LS is tender w/ROM post-op     Lab Results  Component Value Date   WBC 7.2 02/12/2014   HGB 14.0 02/12/2014   HCT 42.5 02/12/2014   PLT 217.0 02/12/2014   GLUCOSE 92 02/12/2014   CHOL 207* 02/12/2014   TRIG 114.0 02/12/2014   HDL 58.40 02/12/2014   LDLDIRECT 138.9 02/14/2012   LDLCALC 126* 02/12/2014   ALT 11 02/12/2014   AST 15 02/12/2014   NA 140 02/12/2014   K 4.2 02/12/2014   CL 107 02/12/2014   CREATININE 0.7 02/12/2014   BUN 15 02/12/2014   CO2 26 02/12/2014   TSH 2.58 03/24/2014   HGBA1C 5.6 05/23/2006      Assessment & Plan:

## 2014-08-11 NOTE — Assessment & Plan Note (Addendum)
We discussed age appropriate health related issues, including available/recomended screening tests and vaccinations. We discussed a need for adhering to healthy diet and exercise. Labs/EKG were reviewed/ordered. All questions were answered.  Vaccines are up up to date Sch colonoscopy

## 2014-08-11 NOTE — Progress Notes (Signed)
Pre visit review using our clinic review tool, if applicable. No additional management support is needed unless otherwise documented below in the visit note. 

## 2014-08-13 ENCOUNTER — Ambulatory Visit (INDEPENDENT_AMBULATORY_CARE_PROVIDER_SITE_OTHER): Payer: BLUE CROSS/BLUE SHIELD

## 2014-08-13 ENCOUNTER — Other Ambulatory Visit: Payer: Self-pay | Admitting: Women's Health

## 2014-08-13 ENCOUNTER — Ambulatory Visit (INDEPENDENT_AMBULATORY_CARE_PROVIDER_SITE_OTHER): Payer: BLUE CROSS/BLUE SHIELD | Admitting: Women's Health

## 2014-08-13 ENCOUNTER — Encounter: Payer: Self-pay | Admitting: Women's Health

## 2014-08-13 VITALS — BP 124/80 | Ht 66.0 in | Wt 142.0 lb

## 2014-08-13 DIAGNOSIS — R87618 Other abnormal cytological findings on specimens from cervix uteri: Secondary | ICD-10-CM

## 2014-08-13 DIAGNOSIS — R8789 Other abnormal findings in specimens from female genital organs: Secondary | ICD-10-CM | POA: Diagnosis not present

## 2014-08-13 DIAGNOSIS — D259 Leiomyoma of uterus, unspecified: Secondary | ICD-10-CM

## 2014-08-13 DIAGNOSIS — N8320 Unspecified ovarian cysts: Secondary | ICD-10-CM | POA: Diagnosis not present

## 2014-08-13 DIAGNOSIS — N83202 Unspecified ovarian cyst, left side: Secondary | ICD-10-CM

## 2014-08-13 NOTE — Progress Notes (Signed)
Patient ID: Jamie Roberts, female   DOB: November 20, 1955, 59 y.o.   MRN: [id number] Presents for ultrasound. Was having some vague low pelvic discomfort, also  endometrial cells noted on normal Pap at annual exam. Postmenopausal/no bleeding, has stopped HRT.  Exam: Appears well. Ultrasound: At least 2 fibroids seen. 24 x 19 mm left fundus, 22 x 19 x 18 mm  right pedunculated, right ovary normal. Left ovary echo-free thin-walled avascular cyst 10 x 6 mm. No free fluid seen. Transvaginal images. Endometrium 3.3 mm.  Fibroid uterus Thin endometrium Left ovarian cyst  Plan: Reviewed endometrium thin, will watch at this time. Small fibroids asymptomatic. Left ovarian cyst will check CA 125.

## 2014-08-14 ENCOUNTER — Other Ambulatory Visit: Payer: Self-pay | Admitting: Women's Health

## 2014-08-14 DIAGNOSIS — N83202 Unspecified ovarian cyst, left side: Secondary | ICD-10-CM

## 2014-08-14 LAB — CA 125: CA 125: 7 U/mL (ref <35)

## 2014-09-05 ENCOUNTER — Ambulatory Visit (AMBULATORY_SURGERY_CENTER): Payer: Self-pay | Admitting: *Deleted

## 2014-09-05 VITALS — Ht 67.0 in | Wt 142.4 lb

## 2014-09-05 DIAGNOSIS — Z1211 Encounter for screening for malignant neoplasm of colon: Secondary | ICD-10-CM

## 2014-09-05 MED ORDER — NA SULFATE-K SULFATE-MG SULF 17.5-3.13-1.6 GM/177ML PO SOLN
1.0000 | Freq: Once | ORAL | Status: DC
Start: 2014-09-05 — End: 2014-09-18

## 2014-09-05 NOTE — Progress Notes (Signed)
Denies allergies to eggs or soy products. Denies complications with sedation or anesthesia. Denies O2 use. Denies use of diet or weight loss medications.  Emmi instructions given for colonoscopy.  

## 2014-09-18 ENCOUNTER — Encounter: Payer: Self-pay | Admitting: Internal Medicine

## 2014-09-18 ENCOUNTER — Ambulatory Visit (AMBULATORY_SURGERY_CENTER): Payer: BLUE CROSS/BLUE SHIELD | Admitting: Internal Medicine

## 2014-09-18 VITALS — BP 118/74 | HR 57 | Temp 97.1°F | Resp 12 | Ht 67.0 in | Wt 142.0 lb

## 2014-09-18 DIAGNOSIS — Z1211 Encounter for screening for malignant neoplasm of colon: Secondary | ICD-10-CM | POA: Diagnosis not present

## 2014-09-18 MED ORDER — SODIUM CHLORIDE 0.9 % IV SOLN: 500.0000 mL | INTRAVENOUS | Status: DC

## 2014-09-18 NOTE — Patient Instructions (Signed)
YOU HAD AN ENDOSCOPIC PROCEDURE TODAY AT [facility]:   Refer to the procedure report that was given to you for any specific questions about what was found during the examination.  If the procedure report does not answer your questions, please call your gastroenterologist to clarify.  If you requested that your care partner not be given the details of your procedure findings, then the procedure report has been included in a sealed envelope for you to review at your convenience later.  YOU SHOULD EXPECT: Some feelings of bloating in the abdomen. Passage of more gas than usual.  Walking can help get rid of the air that was put into your GI tract during the procedure and reduce the bloating. If you had a lower endoscopy (such as a colonoscopy or flexible sigmoidoscopy) you may notice spotting of blood in your stool or on the toilet paper. If you underwent a bowel prep for your procedure, you may not have a normal bowel movement for a few days.  Please Note:  You might notice some irritation and congestion in your nose or some drainage.  This is from the oxygen used during your procedure.  There is no need for concern and it should clear up in a day or so.  SYMPTOMS TO REPORT IMMEDIATELY:   Following lower endoscopy (colonoscopy or flexible sigmoidoscopy):  Excessive amounts of blood in the stool  Significant tenderness or worsening of abdominal pains  Swelling of the abdomen that is new, acute  Fever of 100F or higher   For urgent or emergent issues, a gastroenterologist can be reached at any hour by calling [phone].   DIET: Your first meal following the procedure should be a small meal and then it is ok to progress to your normal diet. Heavy or fried foods are harder to digest and may make you feel nauseous or bloated.  Likewise, meals heavy in dairy and vegetables can increase bloating.  Drink plenty of fluids but you should avoid alcoholic beverages for 24  hours.  ACTIVITY:  You should plan to take it easy for the rest of today and you should NOT DRIVE or use heavy machinery until tomorrow (because of the sedation medicines used during the test).    FOLLOW UP: Our staff will call the number listed on your records the next business day following your procedure to check on you and address any questions or concerns that you may have regarding the information given to you following your procedure. If we do not reach you, we will leave a message.  However, if you are feeling well and you are not experiencing any problems, there is no need to return our call.  We will assume that you have returned to your regular daily activities without incident.  If any biopsies were taken you will be contacted by phone or by letter within the next 1-3 weeks.  Please call us at [phone] if you have not heard about the biopsies in 3 weeks.    SIGNATURES/CONFIDENTIALITY: You and/or your care partner have signed paperwork which will be entered into your electronic medical record.  These signatures attest to the fact that that the information above on your After Visit Summary has been reviewed and is understood.  Full responsibility of the confidentiality of this discharge information lies with you and/or your care-partner.  Repeat Colonoscopy in 10 years

## 2014-09-18 NOTE — Op Note (Signed)
[facility]  [address] [place], [id number]   COLONOSCOPY PROCEDURE REPORT  PATIENT: Jamie Roberts, Jamie Roberts  MR#: [id number] BIRTHDATE: January 15, 1956 , 75  yrs. old GENDER: female ENDOSCOPIST: Eustace Quail, MD REFERRED XB:JYNW Avel Sensor, M.D. PROCEDURE DATE:  09/18/2014 PROCEDURE:   Colonoscopy, screening First Screening Colonoscopy - Avg.  risk and is 50 yrs.  old or older Yes.  Prior Negative Screening - Now for repeat screening. N/A  History of Adenoma - Now for follow-up colonoscopy & has been > or = to 3 yrs.  N/A  Polyps removed today? No Recommend repeat exam, <10 yrs? No ASA CLASS:   Class II INDICATIONS:Screening for colonic neoplasia and Colorectal Neoplasm Risk Assessment for this procedure is average risk. MEDICATIONS: Monitored anesthesia care and Propofol 160 mg IV  DESCRIPTION OF PROCEDURE:   After the risks benefits and alternatives of the procedure were thoroughly explained, informed consent was obtained.  The digital rectal exam revealed no abnormalities of the rectum.   The [id number]  endoscope was introduced through the anus and advanced to the cecum, which was identified by both the appendix and ileocecal valve. No adverse events experienced.   The quality of the prep was good.  (Suprep was used)  The instrument was then slowly withdrawn as the colon was fully examined. Estimated blood loss is zero unless otherwise noted in this procedure report.   COLON FINDINGS: There was mild diverticulosis noted in the sigmoid colon.   The examination was otherwise normal.  Retroflexed views revealed no abnormalities. The time to cecum = 3.0 Withdrawal time = 12.5   The scope was withdrawn and the procedure completed. COMPLICATIONS: There were no immediate complications.  ENDOSCOPIC IMPRESSION: 1.   Mild diverticulosis was noted in the sigmoid colon 2.   The examination was otherwise normal  RECOMMENDATIONS: 1. Continue current colorectal  screening recommendations for "routine risk" patients with a repeat colonoscopy in 10 years.  eSigned:  Eustace Quail, MD 09/18/2014 9:43 AM   cc: The Patient and [facility] [place].  Plotnikov, MD

## 2014-09-18 NOTE — Progress Notes (Signed)
Report to PACU, RN, vss, BBS= Clear.  

## 2014-09-19 ENCOUNTER — Encounter: Payer: BLUE CROSS/BLUE SHIELD | Admitting: Internal Medicine

## 2014-09-19 ENCOUNTER — Telehealth: Payer: Self-pay | Admitting: *Deleted

## 2014-09-19 NOTE — Telephone Encounter (Signed)
  Follow up Call-  Call back number 09/18/2014  Post procedure Call Back phone  # [phone]  Permission to leave phone message Yes     Patient questions:  Do you have a fever, pain , or abdominal swelling? No. Pain Score  0 *  Have you tolerated food without any problems? Yes.    Have you been able to return to your normal activities? Yes.    Do you have any questions about your discharge instructions: Diet   No. Medications  No. Follow up visit  No.  Do you have questions or concerns about your Care? No.  Actions: * If pain score is 4 or above: No action needed, pain <4.

## 2014-09-23 ENCOUNTER — Other Ambulatory Visit: Payer: Self-pay

## 2014-09-23 MED ORDER — TOPIRAMATE 100 MG PO TABS: 100.0000 mg | ORAL_TABLET | Freq: Two times a day (BID) | ORAL | Status: DC

## 2014-10-13 ENCOUNTER — Other Ambulatory Visit: Payer: Self-pay | Admitting: Internal Medicine

## 2014-11-12 ENCOUNTER — Encounter: Payer: Self-pay | Admitting: Women's Health

## 2014-11-12 ENCOUNTER — Ambulatory Visit (INDEPENDENT_AMBULATORY_CARE_PROVIDER_SITE_OTHER): Payer: BLUE CROSS/BLUE SHIELD | Admitting: Women's Health

## 2014-11-12 ENCOUNTER — Ambulatory Visit (INDEPENDENT_AMBULATORY_CARE_PROVIDER_SITE_OTHER): Payer: BLUE CROSS/BLUE SHIELD

## 2014-11-12 ENCOUNTER — Other Ambulatory Visit: Payer: Self-pay | Admitting: Women's Health

## 2014-11-12 VITALS — BP 130/78 | Ht 66.0 in | Wt 142.0 lb

## 2014-11-12 DIAGNOSIS — N8320 Unspecified ovarian cysts: Secondary | ICD-10-CM | POA: Diagnosis not present

## 2014-11-12 DIAGNOSIS — N83202 Unspecified ovarian cyst, left side: Secondary | ICD-10-CM

## 2014-11-12 DIAGNOSIS — D251 Intramural leiomyoma of uterus: Secondary | ICD-10-CM | POA: Diagnosis not present

## 2014-11-12 NOTE — Progress Notes (Signed)
Patient ID: Jamie Roberts, female   DOB: 05-Jun-1955, 59 y.o.   MRN: [id number] Presents for follow-up ultrasound. On screening Pap smear endometrial cells noted, 07/2014 Transvaginal u/s results of small uterine fibroids and  left ovarian cyst, with negative CA-125.  Postmenopausal on no HRT with no vaginal bleeding.     Exam: Appears well U/S:  T/V anteverted UT with fundal intramural fibroid with increased vascular flow 24x25x63mm, RT subserous fibroid with numerous calcifications 24x14x37mm +feeder vessel seen to fibroid, Rt Ovary NL. LT OV continued presence of follicle cyst [id number] neg CFD. Neg CDS.   Unchanged uterine fibroids Unchanged left ovarian cyst  Plan:  Ultrasound findings reviewed minimal change in fibroids and left ovarian cyst. Repeat transvaginal u/s in 1 year to assess for changes if problems.    Follow up if there are any symptoms such as bleeding or low abdominal pain or bloating.

## 2014-11-13 ENCOUNTER — Encounter: Payer: Self-pay | Admitting: Women's Health

## 2015-01-07 ENCOUNTER — Other Ambulatory Visit: Payer: Self-pay | Admitting: Internal Medicine

## 2015-02-22 ENCOUNTER — Other Ambulatory Visit: Payer: Self-pay | Admitting: Internal Medicine

## 2015-02-23 ENCOUNTER — Ambulatory Visit: Payer: BLUE CROSS/BLUE SHIELD | Admitting: Internal Medicine

## 2015-03-03 ENCOUNTER — Encounter: Payer: Self-pay | Admitting: Internal Medicine

## 2015-03-03 ENCOUNTER — Ambulatory Visit (INDEPENDENT_AMBULATORY_CARE_PROVIDER_SITE_OTHER): Payer: BLUE CROSS/BLUE SHIELD | Admitting: Internal Medicine

## 2015-03-03 VITALS — BP 110/88 | HR 66 | Wt 139.0 lb

## 2015-03-03 DIAGNOSIS — F329 Major depressive disorder, single episode, unspecified: Secondary | ICD-10-CM | POA: Diagnosis not present

## 2015-03-03 DIAGNOSIS — G43809 Other migraine, not intractable, without status migrainosus: Secondary | ICD-10-CM

## 2015-03-03 DIAGNOSIS — F32A Depression, unspecified: Secondary | ICD-10-CM

## 2015-03-03 DIAGNOSIS — H8112 Benign paroxysmal vertigo, left ear: Secondary | ICD-10-CM

## 2015-03-03 DIAGNOSIS — H8110 Benign paroxysmal vertigo, unspecified ear: Secondary | ICD-10-CM | POA: Insufficient documentation

## 2015-03-03 MED ORDER — MECLIZINE HCL 12.5 MG PO TABS: 12.5000 mg | ORAL_TABLET | Freq: Three times a day (TID) | ORAL | Status: DC | PRN

## 2015-03-03 MED ORDER — CLOBETASOL PROPIONATE 0.05 % EX FOAM: Freq: Two times a day (BID) | CUTANEOUS | Status: DC | PRN

## 2015-03-03 NOTE — Patient Instructions (Signed)
Benign Positional Vertigo symptoms on the left. Start Meclizine. Start Brandt - Daroff exercise several times a day as dirrected. 

## 2015-03-03 NOTE — Assessment & Plan Note (Signed)
Fluoxetine  

## 2015-03-03 NOTE — Assessment & Plan Note (Signed)
Toprol, Topamax Imitrex prn

## 2015-03-03 NOTE — Progress Notes (Signed)
Pre visit review using our clinic review tool, if applicable. No additional management support is needed unless otherwise documented below in the visit note. 

## 2015-03-03 NOTE — Assessment & Plan Note (Signed)
Benign Positional Vertigo symptoms on the left. Start Meclizine. Start Brandt - Daroff exercise several times a day as dirrected. 

## 2015-03-03 NOTE — Progress Notes (Signed)
Subjective:  Patient ID: Jamie Roberts, female    DOB: 01/12/1956  Age: 59 y.o. MRN: [id number]  CC: No chief complaint on file.   HPI Jamie Roberts presents for depression, HAs, rash f/u. C/o dizzy spells since 3-4 mo ago. Last time pt had dizziness 2 weeks ago  Outpatient Prescriptions Prior to Visit  Medication Sig Dispense Refill  . FLUoxetine (PROZAC) 20 MG capsule TAKE ONE CAPSULE BY MOUTH EVERY DAY 90 capsule 1  . levothyroxine (SYNTHROID, LEVOTHROID) 50 MCG tablet TAKE 1 TABLET BY MOUTH EVERY DAY 90 tablet 2  . metoprolol tartrate (LOPRESSOR) 25 MG tablet Take 25 mg by mouth as needed.    . SUMAtriptan (IMITREX) 100 MG tablet TAKE 1 TABLET EVERY 2 HOURS AS NEEDED FOR MIGRAINE 12 tablet 3  . topiramate (TOPAMAX) 100 MG tablet Take 1 tablet (100 mg total) by mouth 2 (two) times daily. 180 tablet 1  . levothyroxine (SYNTHROID, LEVOTHROID) 50 MCG tablet TAKE 1 TABLET BY MOUTH EVERY DAY 90 tablet 2  . estradiol-norethindrone (COMBIPATCH) 0.05-0.14 MG/DAY Place 1 patch onto the skin 2 (two) times a week. 24 patch 4  . Cholecalciferol 1000 UNITS tablet Take 2,000 Units by mouth daily. Reported on 03/03/2015     No facility-administered medications prior to visit.    ROS Review of Systems  Constitutional: Negative for chills, activity change, appetite change, fatigue and unexpected weight change.  HENT: Negative for congestion, mouth sores and sinus pressure.   Eyes: Negative for visual disturbance.  Respiratory: Negative for cough and chest tightness.   Gastrointestinal: Negative for nausea and abdominal pain.  Genitourinary: Negative for frequency, difficulty urinating and vaginal pain.  Musculoskeletal: Negative for back pain and gait problem.  Skin: Negative for pallor and rash.  Neurological: Positive for dizziness and headaches. Negative for tremors, weakness and numbness.  Psychiatric/Behavioral: Negative for suicidal ideas, confusion and sleep disturbance.    Objective:    BP 110/88 mmHg  Pulse 66  Wt 139 lb (63.05 kg)  SpO2 98%  BP Readings from Last 3 Encounters:  03/03/15 110/88  11/12/14 130/78  09/18/14 118/74    Wt Readings from Last 3 Encounters:  03/03/15 139 lb (63.05 kg)  11/12/14 142 lb (64.411 kg)  09/18/14 142 lb (64.411 kg)    Physical Exam  Constitutional: She appears well-developed. No distress.  HENT:  Head: Normocephalic.  Right Ear: External ear normal.  Left Ear: External ear normal.  Nose: Nose normal.  Mouth/Throat: Oropharynx is clear and moist.  Eyes: Conjunctivae are normal. Pupils are equal, round, and reactive to light. Right eye exhibits no discharge. Left eye exhibits no discharge.  Neck: Normal range of motion. Neck supple. No JVD present. No tracheal deviation present. No thyromegaly present.  Cardiovascular: Normal rate, regular rhythm and normal heart sounds.   Pulmonary/Chest: No stridor. No respiratory distress. She has no wheezes.  Abdominal: Soft. Bowel sounds are normal. She exhibits no distension and no mass. There is no tenderness. There is no rebound and no guarding.  Musculoskeletal: She exhibits no edema or tenderness.  Lymphadenopathy:    She has no cervical adenopathy.  Neurological: She displays normal reflexes. No cranial nerve deficit. She exhibits normal muscle tone. Coordination normal.  Skin: No rash noted. No erythema.  Psychiatric: She has a normal mood and affect. Her behavior is normal. Judgment and thought content normal.   H-P (+) Lab Results  Component Value Date   WBC 7.0 08/11/2014   HGB 13.5 08/11/2014   HCT  40.3 08/11/2014   PLT 239.0 08/11/2014   GLUCOSE 87 08/11/2014   CHOL 174 08/11/2014   TRIG 81.0 08/11/2014   HDL 53.00 08/11/2014   LDLDIRECT 138.9 02/14/2012   LDLCALC 105* 08/11/2014   ALT 10 08/11/2014   AST 13 08/11/2014   NA 142 08/11/2014   K 3.8 08/11/2014   CL 110 08/11/2014   CREATININE 0.85 08/11/2014   BUN 14 08/11/2014   CO2 23 08/11/2014   TSH 4.43  08/11/2014   HGBA1C 5.6 05/23/2006    No results found.  Assessment & Plan:   Diagnoses and all orders for this visit:  Depression  Benign paroxysmal positional vertigo, left  Other orders -     clobetasol (OLUX) 0.05 % topical foam; Apply topically 2 (two) times daily as needed. Please dispense 3 mo supply. -     Discontinue: meclizine (ANTIVERT) 12.5 MG tablet; Take 1 tablet (12.5 mg total) by mouth 3 (three) times daily as needed for dizziness. -     meclizine (ANTIVERT) 12.5 MG tablet; Take 1 tablet (12.5 mg total) by mouth 3 (three) times daily as needed for dizziness.  I am having Jamie Roberts maintain her estradiol-norethindrone, metoprolol tartrate, levothyroxine, topiramate, SUMAtriptan, FLUoxetine, Cholecalciferol, clobetasol, and meclizine.  Meds ordered this encounter  Medications  . Cholecalciferol 5000 units TABS    Sig: Take 1 tablet by mouth once a week.  . clobetasol (OLUX) 0.05 % topical foam    Sig: Apply topically 2 (two) times daily as needed. Please dispense 3 mo supply.    Dispense:  300 g    Refill:  1  . DISCONTD: meclizine (ANTIVERT) 12.5 MG tablet    Sig: Take 1 tablet (12.5 mg total) by mouth 3 (three) times daily as needed for dizziness.    Dispense:  60 tablet    Refill:  1  . meclizine (ANTIVERT) 12.5 MG tablet    Sig: Take 1 tablet (12.5 mg total) by mouth 3 (three) times daily as needed for dizziness.    Dispense:  60 tablet    Refill:  1     Follow-up: Return in about 6 months (around 08/31/2015) for Wellness Exam.  Walker Kehr, MD

## 2015-03-18 ENCOUNTER — Other Ambulatory Visit: Payer: Self-pay | Admitting: Internal Medicine

## 2015-03-18 NOTE — Telephone Encounter (Signed)
Please advise, thanks.

## 2015-06-17 ENCOUNTER — Other Ambulatory Visit: Payer: Self-pay | Admitting: Internal Medicine

## 2015-06-18 ENCOUNTER — Other Ambulatory Visit: Payer: Self-pay | Admitting: Internal Medicine

## 2015-07-17 ENCOUNTER — Encounter: Payer: Self-pay | Admitting: Internal Medicine

## 2015-07-17 ENCOUNTER — Ambulatory Visit (INDEPENDENT_AMBULATORY_CARE_PROVIDER_SITE_OTHER): Payer: BLUE CROSS/BLUE SHIELD | Admitting: Internal Medicine

## 2015-07-17 VITALS — BP 120/70 | HR 93 | Temp 98.3°F | Resp 20 | Wt 145.0 lb

## 2015-07-17 DIAGNOSIS — J019 Acute sinusitis, unspecified: Secondary | ICD-10-CM

## 2015-07-17 MED ORDER — HYDROCODONE-HOMATROPINE 5-1.5 MG/5ML PO SYRP: 5.0000 mL | ORAL_SOLUTION | Freq: Four times a day (QID) | ORAL | Status: DC | PRN

## 2015-07-17 MED ORDER — LEVOFLOXACIN 500 MG PO TABS: 500.0000 mg | ORAL_TABLET | Freq: Every day | ORAL | Status: DC

## 2015-07-17 NOTE — Progress Notes (Signed)
   Subjective:    Patient ID: Jamie Roberts, female    DOB: 13-Nov-1955, 60 y.o.   MRN: [id number]  HPI   Here with 2-3 days acute onset fever, facial pain, pressure, headache, general weakness and malaise, and greenish d/c, with mild ST and cough, but pt denies chest pain, wheezing, increased sob or doe, orthopnea, PND, increased LE swelling, palpitations, dizziness or syncope.   Past Medical History  Diagnosis Date  . Variants of migraine without mention of intractable migraine   . Cataracts, bilateral 2010  . Psoriasis 2010   Past Surgical History  Procedure Laterality Date  . Tonsillectomy      AGE 29  . Microdiskectomy l4-5 l Left 12/15    reports that she has never smoked. She has never used smokeless tobacco. She reports that she does not drink alcohol or use illicit drugs. family history is negative for Colon cancer. Allergies  Allergen Reactions  . Meloxicam Nausea Only   Current Outpatient Prescriptions on File Prior to Visit  Medication Sig Dispense Refill  . Cholecalciferol 5000 units TABS Take 1 tablet by mouth once a week.    . clobetasol (OLUX) 0.05 % topical foam Apply topically 2 (two) times daily as needed. Please dispense 3 mo supply. 300 g 1  . FLUoxetine (PROZAC) 20 MG capsule TAKE ONE CAPSULE BY MOUTH EVERY DAY 90 capsule 1  . levothyroxine (SYNTHROID, LEVOTHROID) 50 MCG tablet TAKE 1 TABLET BY MOUTH EVERY DAY 90 tablet 2  . meclizine (ANTIVERT) 12.5 MG tablet Take 1 tablet (12.5 mg total) by mouth 3 (three) times daily as needed for dizziness. 60 tablet 1  . metoprolol tartrate (LOPRESSOR) 25 MG tablet Take 25 mg by mouth as needed.    . SUMAtriptan (IMITREX) 100 MG tablet TAKE 1 TABLET EVERY 2 HOURS AS NEEDED FOR MIGRAINE 12 tablet 3  . topiramate (TOPAMAX) 100 MG tablet TAKE 1 TABLET (100 MG TOTAL) BY MOUTH 2 (TWO) TIMES DAILY. 180 tablet 1  . topiramate (TOPAMAX) 100 MG tablet TAKE 1 TABLET BY MOUTH TWICE A DAY 60 tablet 2  . estradiol-norethindrone  (COMBIPATCH) 0.05-0.14 MG/DAY Place 1 patch onto the skin 2 (two) times a week. 24 patch 4   No current facility-administered medications on file prior to visit.   Review of Systems All otherwise neg per pt     Objective:   Physical Exam BP 120/70 mmHg  Pulse 93  Temp(Src) 98.3 F (36.8 C) (Oral)  Resp 20  Wt 145 lb (65.772 kg)  SpO2 98% VS noted,  Constitutional: Pt appears in no apparent distress HENT: Head: NCAT.  Right Ear: External ear normal.  Left Ear: External ear normal.  Bilat tm's with mild erythema.  Max sinus areas mild tender.  Pharynx with mild erythema, no exudate Eyes: . Pupils are equal, round, and reactive to light. Conjunctivae and EOM are normal Neck: Normal range of motion. Neck supple.  Cardiovascular: Normal rate and regular rhythm.   Pulmonary/Chest: Effort normal and breath sounds without rales or wheezing.  Neurological: Pt is alert. Not confused , motor grossly intact Skin: Skin is warm. No rash, no LE edema Psychiatric: Pt behavior is normal. No agitation.     Assessment & Plan:

## 2015-07-17 NOTE — Patient Instructions (Signed)
Please take all new medication as prescribed - the antibiotic, as well as cough medicine if needed  Please continue all other medications as before, and refills have been done if requested.  Please have the pharmacy call with any other refills you may need.  Please keep your appointments with your specialists as you may have planned

## 2015-07-17 NOTE — Progress Notes (Signed)
Pre visit review using our clinic review tool, if applicable. No additional management support is needed unless otherwise documented below in the visit note. 

## 2015-07-17 NOTE — Assessment & Plan Note (Signed)
Mild to mod, for antibx course,  to f/u any worsening symptoms or concerns 

## 2015-07-21 ENCOUNTER — Other Ambulatory Visit: Payer: Self-pay | Admitting: Internal Medicine

## 2015-07-30 ENCOUNTER — Other Ambulatory Visit: Payer: Self-pay | Admitting: Gynecology

## 2015-07-30 DIAGNOSIS — Z1231 Encounter for screening mammogram for malignant neoplasm of breast: Secondary | ICD-10-CM

## 2015-08-11 ENCOUNTER — Ambulatory Visit
Admission: RE | Admit: 2015-08-11 | Discharge: 2015-08-11 | Disposition: A | Discharge status: Home / Self Care | Payer: BLUE CROSS/BLUE SHIELD | Source: Ambulatory Visit | Attending: Gynecology | Admitting: Gynecology

## 2015-08-11 ENCOUNTER — Ambulatory Visit (INDEPENDENT_AMBULATORY_CARE_PROVIDER_SITE_OTHER): Payer: BLUE CROSS/BLUE SHIELD | Admitting: Internal Medicine

## 2015-08-11 ENCOUNTER — Encounter: Payer: Self-pay | Admitting: Internal Medicine

## 2015-08-11 ENCOUNTER — Other Ambulatory Visit (INDEPENDENT_AMBULATORY_CARE_PROVIDER_SITE_OTHER): Payer: BLUE CROSS/BLUE SHIELD

## 2015-08-11 VITALS — BP 140/96 | HR 64 | Ht 67.0 in | Wt 143.0 lb

## 2015-08-11 DIAGNOSIS — Z0000 Encounter for general adult medical examination without abnormal findings: Secondary | ICD-10-CM

## 2015-08-11 DIAGNOSIS — Z23 Encounter for immunization: Secondary | ICD-10-CM | POA: Diagnosis not present

## 2015-08-11 DIAGNOSIS — R030 Elevated blood-pressure reading, without diagnosis of hypertension: Secondary | ICD-10-CM

## 2015-08-11 DIAGNOSIS — N182 Chronic kidney disease, stage 2 (mild): Secondary | ICD-10-CM

## 2015-08-11 DIAGNOSIS — Z1231 Encounter for screening mammogram for malignant neoplasm of breast: Secondary | ICD-10-CM

## 2015-08-11 LAB — LIPID PANEL
CHOLESTEROL: 196 mg/dL (ref 0–200)
HDL: 65.1 mg/dL (ref >39.00)
LDL CALC: 117 mg/dL — AB (ref 0–99)
NONHDL: 131.39
Total CHOL/HDL Ratio: 3
Triglycerides: 74 mg/dL (ref 0.0–149.0)
VLDL: 14.8 mg/dL (ref 0.0–40.0)

## 2015-08-11 LAB — URINALYSIS
BILIRUBIN URINE: NEGATIVE
HGB URINE DIPSTICK: NEGATIVE
Ketones, ur: NEGATIVE
Leukocytes, UA: NEGATIVE
NITRITE: NEGATIVE
Specific Gravity, Urine: 1.01 (ref 1.000–1.030)
TOTAL PROTEIN, URINE-UPE24: NEGATIVE
URINE GLUCOSE: NEGATIVE
UROBILINOGEN UA: 0.2 (ref 0.0–1.0)
pH: 8 (ref 5.0–8.0)

## 2015-08-11 LAB — BASIC METABOLIC PANEL
BUN: 18 mg/dL (ref 6–23)
CHLORIDE: 107 meq/L (ref 96–112)
CO2: 27 mEq/L (ref 19–32)
CREATININE: 0.76 mg/dL (ref 0.40–1.20)
Calcium: 9.3 mg/dL (ref 8.4–10.5)
GFR: 82.92 mL/min (ref >60.00)
GLUCOSE: 96 mg/dL (ref 70–99)
POTASSIUM: 4.3 meq/L (ref 3.5–5.1)
Sodium: 139 mEq/L (ref 135–145)

## 2015-08-11 LAB — CBC WITH DIFFERENTIAL/PLATELET
BASOS ABS: 0 10*3/uL (ref 0.0–0.1)
BASOS PCT: 0.7 % (ref 0.0–3.0)
Eosinophils Absolute: 0.2 10*3/uL (ref 0.0–0.7)
Eosinophils Relative: 3.7 % (ref 0.0–5.0)
HEMATOCRIT: 41.6 % (ref 36.0–46.0)
HEMOGLOBIN: 13.9 g/dL (ref 12.0–15.0)
LYMPHS PCT: 29.8 % (ref 12.0–46.0)
Lymphs Abs: 1.6 10*3/uL (ref 0.7–4.0)
MCHC: 33.4 g/dL (ref 30.0–36.0)
MCV: 80.5 fl (ref 78.0–100.0)
MONOS PCT: 11.2 % (ref 3.0–12.0)
Monocytes Absolute: 0.6 10*3/uL (ref 0.1–1.0)
NEUTROS ABS: 2.9 10*3/uL (ref 1.4–7.7)
Neutrophils Relative %: 54.6 % (ref 43.0–77.0)
PLATELETS: 212 10*3/uL (ref 150.0–400.0)
RBC: 5.17 Mil/uL — ABNORMAL HIGH (ref 3.87–5.11)
RDW: 15.1 % (ref 11.5–15.5)
WBC: 5.3 10*3/uL (ref 4.0–10.5)

## 2015-08-11 LAB — HEPATIC FUNCTION PANEL
ALBUMIN: 4.3 g/dL (ref 3.5–5.2)
ALT: 10 U/L (ref 0–35)
AST: 12 U/L (ref 0–37)
Alkaline Phosphatase: 61 U/L (ref 39–117)
BILIRUBIN TOTAL: 1 mg/dL (ref 0.2–1.2)
Bilirubin, Direct: 0.2 mg/dL (ref 0.0–0.3)
Total Protein: 7.6 g/dL (ref 6.0–8.3)

## 2015-08-11 LAB — TSH: TSH: 2.73 u[IU]/mL (ref 0.35–4.50)

## 2015-08-11 MED ORDER — SUMATRIPTAN SUCCINATE 100 MG PO TABS: ORAL_TABLET | ORAL | Status: DC

## 2015-08-11 MED ORDER — FLUOXETINE HCL 20 MG PO CAPS: 20.0000 mg | ORAL_CAPSULE | Freq: Every day | ORAL | Status: DC

## 2015-08-11 MED ORDER — TOPIRAMATE 100 MG PO TABS: 100.0000 mg | ORAL_TABLET | Freq: Two times a day (BID) | ORAL | Status: DC

## 2015-08-11 MED ORDER — LEVOTHYROXINE SODIUM 50 MCG PO TABS: 50.0000 ug | ORAL_TABLET | Freq: Every day | ORAL | Status: DC

## 2015-08-11 NOTE — Progress Notes (Signed)
Subjective:  Patient ID: Jamie Roberts, female    DOB: July 17, 1955  Age: 60 y.o. MRN: [id number]  CC: Annual Exam   HPI Jamie Roberts presents for a well exam  Outpatient Prescriptions Prior to Visit  Medication Sig Dispense Refill  . Cholecalciferol 5000 units TABS Take 1 tablet by mouth once a week.    . clobetasol (OLUX) 0.05 % topical foam Apply topically 2 (two) times daily as needed. Please dispense 3 mo supply. 300 g 1  . FLUoxetine (PROZAC) 20 MG capsule TAKE ONE CAPSULE BY MOUTH EVERY DAY 90 capsule 1  . levothyroxine (SYNTHROID, LEVOTHROID) 50 MCG tablet TAKE 1 TABLET BY MOUTH EVERY DAY 90 tablet 2  . meclizine (ANTIVERT) 12.5 MG tablet Take 1 tablet (12.5 mg total) by mouth 3 (three) times daily as needed for dizziness. 60 tablet 1  . metoprolol tartrate (LOPRESSOR) 25 MG tablet Take 25 mg by mouth as needed.    . SUMAtriptan (IMITREX) 100 MG tablet TAKE 1 TABLET EVERY 2 HOURS AS NEEDED FOR MIGRAINE 12 tablet 3  . topiramate (TOPAMAX) 100 MG tablet TAKE 1 TABLET (100 MG TOTAL) BY MOUTH 2 (TWO) TIMES DAILY. 180 tablet 1  . topiramate (TOPAMAX) 100 MG tablet TAKE 1 TABLET BY MOUTH TWICE A DAY 60 tablet 2  . HYDROcodone-homatropine (HYCODAN) 5-1.5 MG/5ML syrup Take 5 mLs by mouth every 6 (six) hours as needed for cough. 180 mL 0  . levofloxacin (LEVAQUIN) 500 MG tablet Take 1 tablet (500 mg total) by mouth daily. 10 tablet 0  . estradiol-norethindrone (COMBIPATCH) 0.05-0.14 MG/DAY Place 1 patch onto the skin 2 (two) times a week. 24 patch 4   No facility-administered medications prior to visit.    ROS Review of Systems  Constitutional: Negative for chills, activity change, appetite change, fatigue and unexpected weight change.  HENT: Negative for congestion, mouth sores and sinus pressure.   Eyes: Negative for visual disturbance.  Respiratory: Negative for cough and chest tightness.   Gastrointestinal: Negative for nausea and abdominal pain.  Genitourinary: Negative for  frequency, difficulty urinating and vaginal pain.  Musculoskeletal: Negative for back pain and gait problem.  Skin: Negative for pallor and rash.  Neurological: Negative for dizziness, tremors, weakness, numbness and headaches.  Psychiatric/Behavioral: Negative for confusion and sleep disturbance.    Objective:  BP 140/96 mmHg  Pulse 64  Ht 5\' 7"  (1.702 m)  Wt 143 lb (64.864 kg)  BMI 22.39 kg/m2  SpO2 97%  BP Readings from Last 3 Encounters:  08/11/15 140/96  07/17/15 120/70  03/03/15 110/88    Wt Readings from Last 3 Encounters:  08/11/15 143 lb (64.864 kg)  07/17/15 145 lb (65.772 kg)  03/03/15 139 lb (63.05 kg)    Physical Exam  Constitutional: She appears well-developed. No distress.  HENT:  Head: Normocephalic.  Right Ear: External ear normal.  Left Ear: External ear normal.  Nose: Nose normal.  Mouth/Throat: Oropharynx is clear and moist.  Eyes: Conjunctivae are normal. Pupils are equal, round, and reactive to light. Right eye exhibits no discharge. Left eye exhibits no discharge.  Neck: Normal range of motion. Neck supple. No JVD present. No tracheal deviation present. No thyromegaly present.  Cardiovascular: Normal rate, regular rhythm and normal heart sounds.   Pulmonary/Chest: No stridor. No respiratory distress. She has no wheezes.  Abdominal: Soft. Bowel sounds are normal. She exhibits no distension and no mass. There is no tenderness. There is no rebound and no guarding.  Musculoskeletal: She exhibits no edema or  tenderness.  Lymphadenopathy:    She has no cervical adenopathy.  Neurological: She displays normal reflexes. No cranial nerve deficit. She exhibits normal muscle tone. Coordination abnormal.  Skin: No rash noted. No erythema.  Psychiatric: She has a normal mood and affect. Her behavior is normal. Judgment and thought content normal.    Lab Results  Component Value Date   WBC 7.0 08/11/2014   HGB 13.5 08/11/2014   HCT 40.3 08/11/2014   PLT  239.0 08/11/2014   GLUCOSE 87 08/11/2014   CHOL 174 08/11/2014   TRIG 81.0 08/11/2014   HDL 53.00 08/11/2014   LDLDIRECT 138.9 02/14/2012   LDLCALC 105* 08/11/2014   ALT 10 08/11/2014   AST 13 08/11/2014   NA 142 08/11/2014   K 3.8 08/11/2014   CL 110 08/11/2014   CREATININE 0.85 08/11/2014   BUN 14 08/11/2014   CO2 23 08/11/2014   TSH 4.43 08/11/2014   HGBA1C 5.6 05/23/2006    No results found.  Assessment & Plan:   There are no diagnoses linked to this encounter. I have discontinued Jamie Roberts's levofloxacin and HYDROcodone-homatropine. I am also having her maintain her estradiol-norethindrone, metoprolol tartrate, levothyroxine, FLUoxetine, Cholecalciferol, clobetasol, meclizine, topiramate, topiramate, and SUMAtriptan.  No orders of the defined types were placed in this encounter.     Follow-up: No Follow-up on file.  Walker Kehr, MD

## 2015-08-11 NOTE — Assessment & Plan Note (Signed)
Nl BP at home and at work

## 2015-08-11 NOTE — Assessment & Plan Note (Signed)
6/17 Stage 2-3

## 2015-08-11 NOTE — Progress Notes (Signed)
Pre visit review using our clinic review tool, if applicable. No additional management support is needed unless otherwise documented below in the visit note. 

## 2015-08-12 LAB — HEPATITIS C ANTIBODY: HCV AB: NEGATIVE

## 2015-08-19 ENCOUNTER — Other Ambulatory Visit: Payer: Self-pay | Admitting: *Deleted

## 2015-08-25 ENCOUNTER — Other Ambulatory Visit: Payer: Self-pay

## 2015-08-25 ENCOUNTER — Other Ambulatory Visit: Payer: Self-pay | Admitting: *Deleted

## 2015-08-25 MED ORDER — TOPIRAMATE 100 MG PO TABS: 100.0000 mg | ORAL_TABLET | Freq: Two times a day (BID) | ORAL | Status: DC

## 2015-08-27 ENCOUNTER — Ambulatory Visit (INDEPENDENT_AMBULATORY_CARE_PROVIDER_SITE_OTHER): Payer: BLUE CROSS/BLUE SHIELD | Admitting: Women's Health

## 2015-08-27 ENCOUNTER — Encounter: Payer: Self-pay | Admitting: Women's Health

## 2015-08-27 VITALS — BP 128/80 | Ht 67.0 in | Wt 144.0 lb

## 2015-08-27 DIAGNOSIS — Z01419 Encounter for gynecological examination (general) (routine) without abnormal findings: Secondary | ICD-10-CM

## 2015-08-27 NOTE — Progress Notes (Signed)
Denia Quint 04-Sep-1955 [id number]    History:    Presents for annual exam.  Postmenopausal on no HRT with no bleeding. Not sexually active. CombiPatch in the past. Occasional hot flushes but tolerable. Hypothyroidism and GERD managed by primary care. Normal Pap and mammogram history. 09/2014 negative colonoscopy. Pap 2016 showed endometrial cells with an endometrial lining 3.3 negative HR HPV. Normal labs at primary care.  Past medical history, past surgical history, family history and social history were all reviewed and documented in the EPIC chart. 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 Son in nursing school scheduled to graduate this year.  ROS:  A ROS was performed and pertinent positives and negatives are included.  Exam:  Filed Vitals:   08/27/15 1131  BP: 128/80    General appearance:  Normal Thyroid:  Symmetrical, normal in size, without palpable masses or nodularity. Respiratory  Auscultation:  Clear without wheezing or rhonchi Cardiovascular  Auscultation:  Regular rate, without rubs, murmurs or gallops  Edema/varicosities:  Not grossly evident Abdominal  Soft,nontender, without masses, guarding or rebound.  Liver/spleen:  No organomegaly noted  Hernia:  None appreciated  Skin  Inspection:  Grossly normal   Breasts: Examined lying and sitting.     Right: Without masses, retractions, discharge or axillary adenopathy.     Left: Without masses, retractions, discharge or axillary adenopathy. Gentitourinary   Inguinal/mons:  Normal without inguinal adenopathy  External genitalia:  Normal  BUS/Urethra/Skene's glands:  Normal  Vagina:  Normal  Cervix:  Normal  Uterus:   normal in size, shape and contour.  Midline and mobile  Adnexa/parametria:     Rt: Without masses or tenderness.   Lt: Without masses or tenderness.  Anus and perineum: Normal  Digital rectal exam: Normal sphincter tone without palpated masses or  tenderness  Assessment/Plan:  60 y.o. DW F G1 P1  for annual exam no complaints.  Postmenopausal with no bleeding on no HRT Hypothyroid/GERD-primary care manages labs and meds 2016 back surgery with good relief of pain 2016 Pap endocervical cells noted  Plan: Pap, if normal resume screening guideline recommendations. SBE's, continue annual screening mammogram 3-D tomography reviewed and encouraged history of dense breasts. Exercise, calcium rich diet, vitamin D 1000 daily encouraged.  Huel Cote [facility], 12:15 PM 08/27/2015

## 2015-08-27 NOTE — Patient Instructions (Signed)

## 2015-08-28 LAB — PAP IG W/ RFLX HPV ASCU

## 2015-08-28 LAB — URINALYSIS W MICROSCOPIC + REFLEX CULTURE
BACTERIA UA: NONE SEEN [HPF]
Bilirubin Urine: NEGATIVE
Casts: NONE SEEN [LPF]
Crystals: NONE SEEN [HPF]
GLUCOSE, UA: NEGATIVE
Hgb urine dipstick: NEGATIVE
Ketones, ur: NEGATIVE
LEUKOCYTES UA: NEGATIVE
Nitrite: NEGATIVE
PROTEIN: NEGATIVE
RBC / HPF: NONE SEEN RBC/HPF (ref <=2)
Specific Gravity, Urine: 1.007 (ref 1.001–1.035)
Squamous Epithelial / LPF: NONE SEEN [HPF] (ref <=5)
WBC, UA: NONE SEEN WBC/HPF (ref <=5)
YEAST: NONE SEEN [HPF]
pH: 7.5 (ref 5.0–8.0)

## 2015-08-31 ENCOUNTER — Ambulatory Visit: Payer: BLUE CROSS/BLUE SHIELD | Admitting: Internal Medicine

## 2015-10-11 ENCOUNTER — Other Ambulatory Visit: Payer: Self-pay | Admitting: Internal Medicine

## 2015-10-15 ENCOUNTER — Other Ambulatory Visit: Payer: Self-pay | Admitting: Internal Medicine

## 2015-10-15 ENCOUNTER — Encounter: Payer: Self-pay | Admitting: Internal Medicine

## 2015-10-20 ENCOUNTER — Ambulatory Visit (INDEPENDENT_AMBULATORY_CARE_PROVIDER_SITE_OTHER): Payer: Commercial Managed Care - PPO | Admitting: Internal Medicine

## 2015-10-20 ENCOUNTER — Encounter: Payer: Self-pay | Admitting: Internal Medicine

## 2015-10-20 ENCOUNTER — Other Ambulatory Visit: Payer: Commercial Managed Care - PPO

## 2015-10-20 ENCOUNTER — Ambulatory Visit (INDEPENDENT_AMBULATORY_CARE_PROVIDER_SITE_OTHER)
Admission: RE | Admit: 2015-10-20 | Discharge: 2015-10-20 | Disposition: A | Discharge status: Home / Self Care | Payer: Commercial Managed Care - PPO | Source: Ambulatory Visit | Attending: Internal Medicine | Admitting: Internal Medicine

## 2015-10-20 DIAGNOSIS — Z0289 Encounter for other administrative examinations: Secondary | ICD-10-CM

## 2015-10-20 DIAGNOSIS — R7611 Nonspecific reaction to tuberculin skin test without active tuberculosis: Secondary | ICD-10-CM | POA: Diagnosis not present

## 2015-10-20 DIAGNOSIS — Z9289 Personal history of other medical treatment: Secondary | ICD-10-CM

## 2015-10-20 DIAGNOSIS — G43809 Other migraine, not intractable, without status migrainosus: Secondary | ICD-10-CM

## 2015-10-20 DIAGNOSIS — Z020 Encounter for examination for admission to educational institution: Secondary | ICD-10-CM

## 2015-10-20 DIAGNOSIS — F329 Major depressive disorder, single episode, unspecified: Secondary | ICD-10-CM

## 2015-10-20 DIAGNOSIS — F32A Depression, unspecified: Secondary | ICD-10-CM

## 2015-10-20 NOTE — Assessment & Plan Note (Signed)
Doing good Imitrex prn Topamax

## 2015-10-20 NOTE — Assessment & Plan Note (Addendum)
Form filled out Vaccinations at [facility] Tit[place] obtained

## 2015-10-20 NOTE — Assessment & Plan Note (Addendum)
Doing well Fluoxetine

## 2015-10-20 NOTE — Assessment & Plan Note (Signed)
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

## 2015-10-20 NOTE — Progress Notes (Signed)
Pre visit review using our clinic review tool, if applicable. No additional management support is needed unless otherwise documented below in the visit note. 

## 2015-10-20 NOTE — Progress Notes (Signed)
Subjective:  Patient ID: Jamie Roberts, female    DOB: [birth year]  Age: 60 y.o. MRN: [id number]  CC: No chief complaint on file.   HPI Deshone Okelley presents for NP school paperwork. F/u hypothyroidism, depression and migraines. 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 Cholecalciferol 5000 units TABS Take 1 tablet by mouth once a week.    . clobetasol (OLUX) 0.05 % topical foam Apply topically 2 (two) times daily as needed. Please dispense 3 mo supply. 300 g 1  . FLUoxetine (PROZAC) 20 MG capsule Take 1 capsule (20 mg total) by mouth daily. 90 capsule 3  . levothyroxine (SYNTHROID, LEVOTHROID) 50 MCG tablet Take 1 tablet (50 mcg total) by mouth daily. 90 tablet 3  . meclizine (ANTIVERT) 12.5 MG tablet Take 1 tablet (12.5 mg total) by mouth 3 (three) times daily as needed for dizziness. 60 tablet 1  . metoprolol tartrate (LOPRESSOR) 25 MG tablet Take 25 mg by mouth as needed.    . SUMAtriptan (IMITREX) 100 MG tablet TAKE 1 TABLET EVERY 2 HOURS AS NEEDED FOR MIGRAINE 12 tablet 3  . topiramate (TOPAMAX) 100 MG tablet Take 1 tablet (100 mg total) by mouth 2 (two) times daily. 180 tablet 3  . estradiol-norethindrone (COMBIPATCH) 0.05-0.14 MG/DAY Place 1 patch onto the skin 2 (two) times a week. 24 patch 4   No facility-administered medications prior to visit.     ROS Review of Systems  Constitutional: Negative for activity change, appetite change, chills, fatigue and unexpected weight change.  HENT: Negative for congestion, mouth sores and sinus pressure.   Eyes: Negative for visual disturbance.  Respiratory: Negative for cough and chest tightness.   Gastrointestinal: Negative for abdominal pain and nausea.  Genitourinary: Negative for difficulty urinating, frequency and vaginal pain.  Musculoskeletal: Negative for back pain and gait problem.  Skin: Negative for pallor and rash.  Neurological:  Negative for dizziness, tremors, weakness, numbness and headaches.  Psychiatric/Behavioral: Negative for confusion and sleep disturbance.    Objective:  BP 130/82   Pulse 79   Temp 98.4 F (36.9 C) (Oral)   Ht 5\' 7"  (1.702 m)   Wt 146 lb (66.2 kg)   SpO2 98%   BMI 22.87 kg/m   BP Readings from Last 3 Encounters:  10/20/15 130/82  08/27/15 128/80  08/11/15 (!) 140/96    Wt Readings from Last 3 Encounters:  10/20/15 146 lb (66.2 kg)  08/27/15 144 lb (65.3 kg)  08/11/15 143 lb (64.9 kg)    Physical Exam  Constitutional: She appears well-developed. No distress.  HENT:  Head: Normocephalic.  Right Ear: External ear normal.  Left Ear: External ear normal.  Nose: Nose normal.  Mouth/Throat: Oropharynx is clear and moist.  Eyes: Conjunctivae are normal. Pupils are equal, round, and reactive to light. Right eye exhibits no discharge. Left eye exhibits no discharge.  Neck: Normal range of motion. Neck supple. No JVD present. No tracheal deviation present. No thyromegaly present.  Cardiovascular: Normal rate, regular rhythm and normal heart sounds.   Pulmonary/Chest: No stridor. No respiratory distress. She has no wheezes.  Abdominal: Soft. Bowel sounds are normal. She exhibits no distension and no mass. There is no tenderness. There is no rebound and no guarding.  Musculoskeletal: She exhibits no edema or tenderness.  Lymphadenopathy:    She has no cervical adenopathy.  Neurological: She displays normal reflexes. No cranial  nerve deficit. She exhibits normal muscle tone. Coordination normal.  Skin: No rash noted. No erythema.  Psychiatric: She has a normal mood and affect. Her behavior is normal. Judgment and thought content normal.  >20 min FTF  Lab Results  Component Value Date   WBC 5.3 08/11/2015   HGB 13.9 08/11/2015   HCT 41.6 08/11/2015   PLT 212.0 08/11/2015   GLUCOSE 96 08/11/2015   CHOL 196 08/11/2015   TRIG 74.0 08/11/2015   HDL 65.10 08/11/2015   LDLDIRECT  138.9 02/14/2012   LDLCALC 117 (H) 08/11/2015   ALT 10 08/11/2015   AST 12 08/11/2015   NA 139 08/11/2015   K 4.3 08/11/2015   CL 107 08/11/2015   CREATININE 0.76 08/11/2015   BUN 18 08/11/2015   CO2 27 08/11/2015   TSH 2.73 08/11/2015   HGBA1C 5.6 05/23/2006    Mm Digital Screening Bilateral  Result Date: 08/12/2015 CLINICAL DATA:  Screening. EXAM: DIGITAL SCREENING BILATERAL MAMMOGRAM WITH CAD COMPARISON:  Previous exam(s). ACR Breast Density Category c: The breast tissue is heterogeneously dense, which may obscure small masses. FINDINGS: There are no findings suspicious for malignancy. Images were processed with CAD. IMPRESSION: No mammographic evidence of malignancy. A result letter of this screening mammogram will be mailed directly to the patient. RECOMMENDATION: Screening mammogram in one year. (Code:SM-B-01Y) BI-RADS CATEGORY  1: Negative. Electronically Signed   By: Nolon Nations M.D.   On: 08/12/2015 16:09    Assessment & Plan:   There are no diagnoses linked to this encounter. I am having Ms. Levandoski maintain her estradiol-norethindrone, metoprolol tartrate, Cholecalciferol, clobetasol, meclizine, FLUoxetine, levothyroxine, SUMAtriptan, and topiramate.  No orders of the defined types were placed in this encounter.    Follow-up: No Follow-up on file.  Walker Kehr, MD

## 2015-10-21 LAB — MEASLES/MUMPS/RUBELLA IMMUNITY: Rubella: 26.7 Index — ABNORMAL HIGH (ref <0.90)

## 2015-10-21 LAB — VARICELLA ZOSTER ANTIBODY, IGG: Varicella IgG: 1653 Index — ABNORMAL HIGH (ref <135.00)

## 2015-10-21 LAB — HEPATITIS B SURFACE ANTIBODY, QUANTITATIVE: HEPATITIS B-POST: 416 m[IU]/mL

## 2015-10-24 ENCOUNTER — Encounter: Payer: Self-pay | Admitting: Internal Medicine

## 2015-10-26 ENCOUNTER — Encounter: Payer: Self-pay | Admitting: Internal Medicine

## 2015-11-23 ENCOUNTER — Encounter: Payer: Self-pay | Admitting: Internal Medicine

## 2015-12-25 ENCOUNTER — Encounter: Payer: Self-pay | Admitting: *Deleted

## 2016-03-05 ENCOUNTER — Other Ambulatory Visit: Payer: Self-pay | Admitting: Internal Medicine

## 2016-06-06 ENCOUNTER — Encounter: Payer: Self-pay | Admitting: *Deleted

## 2016-06-06 ENCOUNTER — Telehealth: Payer: Self-pay | Admitting: *Deleted

## 2016-06-06 DIAGNOSIS — N63 Unspecified lump in unspecified breast: Secondary | ICD-10-CM

## 2016-06-06 NOTE — Telephone Encounter (Signed)
Appointment at breast center on 06/09/16 2 2:20 pm, pt voicemail is not set up to leave a message. I will send pt a my chart message, I asked pt to e-mail back to let me know she received message.

## 2016-06-06 NOTE — Telephone Encounter (Signed)
-----   Message from Huel Cote, NP sent at 06/06/2016 10:48 AM EDT ----- Please schedule left diagnostic mammogram, palpable smooth 2 cm mobile nodule at 2:00 position. any time this wk. Not wed pm.  thanks

## 2016-06-07 NOTE — Telephone Encounter (Signed)
Pt informed with time and date.  

## 2016-06-09 ENCOUNTER — Ambulatory Visit
Admission: RE | Admit: 2016-06-09 | Discharge: 2016-06-09 | Disposition: A | Discharge status: Home / Self Care | Payer: Commercial Managed Care - PPO | Source: Ambulatory Visit | Attending: Women's Health | Admitting: Women's Health

## 2016-06-09 DIAGNOSIS — N63 Unspecified lump in unspecified breast: Secondary | ICD-10-CM

## 2016-06-10 ENCOUNTER — Encounter: Payer: Self-pay | Admitting: Women's Health

## 2016-06-15 ENCOUNTER — Ambulatory Visit (INDEPENDENT_AMBULATORY_CARE_PROVIDER_SITE_OTHER): Payer: Commercial Managed Care - PPO | Admitting: Internal Medicine

## 2016-06-15 ENCOUNTER — Encounter: Payer: Self-pay | Admitting: Internal Medicine

## 2016-06-15 VITALS — BP 124/82 | HR 67 | Temp 97.8°F | Ht 67.0 in | Wt 150.1 lb

## 2016-06-15 DIAGNOSIS — Z0000 Encounter for general adult medical examination without abnormal findings: Secondary | ICD-10-CM

## 2016-06-15 MED ORDER — LEVOTHYROXINE SODIUM 50 MCG PO TABS: 50.0000 ug | ORAL_TABLET | Freq: Every day | ORAL | 3 refills | Status: DC

## 2016-06-15 MED ORDER — CLOBETASOL PROPIONATE 0.05 % EX FOAM: Freq: Two times a day (BID) | CUTANEOUS | 1 refills | Status: DC | PRN

## 2016-06-15 MED ORDER — SUMATRIPTAN SUCCINATE 100 MG PO TABS: ORAL_TABLET | ORAL | 3 refills | Status: DC

## 2016-06-15 MED ORDER — MECLIZINE HCL 12.5 MG PO TABS: 12.5000 mg | ORAL_TABLET | Freq: Three times a day (TID) | ORAL | 1 refills | Status: DC | PRN

## 2016-06-15 NOTE — Patient Instructions (Addendum)
Valerian root for sleep  Shingrix vaccine

## 2016-06-15 NOTE — Progress Notes (Signed)
Subjective:  Patient ID: Jamie Roberts, female    DOB: 01-20-1956  Age: 61 y.o. MRN: [id number]  CC: No chief complaint on file.   HPI Alanah Kuna presents for a well exam  Outpatient Medications Prior to Visit  Medication Sig Dispense Refill  . Cholecalciferol 5000 units TABS Take 1 tablet by mouth once a week.    . clobetasol (OLUX) 0.05 % topical foam Apply topically 2 (two) times daily as needed. Please dispense 3 mo supply. 300 g 1  . FLUoxetine (PROZAC) 20 MG capsule Take 1 capsule (20 mg total) by mouth daily. 90 capsule 3  . levothyroxine (SYNTHROID, LEVOTHROID) 50 MCG tablet Take 1 tablet (50 mcg total) by mouth daily. 90 tablet 3  . meclizine (ANTIVERT) 12.5 MG tablet Take 1 tablet (12.5 mg total) by mouth 3 (three) times daily as needed for dizziness. 60 tablet 1  . metoprolol tartrate (LOPRESSOR) 25 MG tablet Take 25 mg by mouth as needed.    . SUMAtriptan (IMITREX) 100 MG tablet TAKE 1 TABLET EVERY 2 HOURS AS NEEDED FOR MIGRAINE 12 tablet 3  . topiramate (TOPAMAX) 100 MG tablet Take 1 tablet (100 mg total) by mouth 2 (two) times daily. 180 tablet 3  . estradiol-norethindrone (COMBIPATCH) 0.05-0.14 MG/DAY Place 1 patch onto the skin 2 (two) times a week. 24 patch 4  . SUMAtriptan (IMITREX) 100 MG tablet TAKE 1 TABLET EVERY 2 HOURS AS NEEDED FOR MIGRAINE 12 tablet 3   No facility-administered medications prior to visit.     ROS Review of Systems  Constitutional: Negative for activity change, appetite change, chills, fatigue and unexpected weight change.  HENT: Negative for congestion, mouth sores and sinus pressure.   Eyes: Negative for visual disturbance.  Respiratory: Negative for cough and chest tightness.   Gastrointestinal: Negative for abdominal pain and nausea.  Genitourinary: Negative for difficulty urinating, frequency and vaginal pain.  Musculoskeletal: Negative for back pain and gait problem.  Skin: Negative for pallor and rash.  Neurological: Negative for  dizziness, tremors, weakness, numbness and headaches.  Psychiatric/Behavioral: Negative for confusion, sleep disturbance and suicidal ideas. The patient is not nervous/anxious.     Objective:  BP 124/82 (BP Location: Left Arm, Patient Position: Sitting, Cuff Size: Normal)   Pulse 67   Temp 97.8 F (36.6 C) (Oral)   Ht 5\' 7"  (1.702 m)   Wt 150 lb 1.3 oz (68.1 kg)   SpO2 99%   BMI 23.51 kg/m   BP Readings from Last 3 Encounters:  06/15/16 124/82  10/20/15 130/82  08/27/15 128/80    Wt Readings from Last 3 Encounters:  06/15/16 150 lb 1.3 oz (68.1 kg)  10/20/15 146 lb (66.2 kg)  08/27/15 144 lb (65.3 kg)    Physical Exam  Constitutional: She appears well-developed. No distress.  HENT:  Head: Normocephalic.  Right Ear: External ear normal.  Left Ear: External ear normal.  Nose: Nose normal.  Mouth/Throat: Oropharynx is clear and moist.  Eyes: Conjunctivae are normal. Pupils are equal, round, and reactive to light. Right eye exhibits no discharge. Left eye exhibits no discharge.  Neck: Normal range of motion. Neck supple. No JVD present. No tracheal deviation present. No thyromegaly present.  Cardiovascular: Normal rate, regular rhythm and normal heart sounds.   Pulmonary/Chest: No stridor. No respiratory distress. She has no wheezes.  Abdominal: Soft. Bowel sounds are normal. She exhibits no distension and no mass. There is no tenderness. There is no rebound and no guarding.  Musculoskeletal: She exhibits  no edema or tenderness.  Lymphadenopathy:    She has no cervical adenopathy.  Neurological: She displays normal reflexes. No cranial nerve deficit. She exhibits normal muscle tone. Coordination normal.  Skin: No rash noted. No erythema.  Psychiatric: She has a normal mood and affect. Her behavior is normal. Judgment and thought content normal.    Lab Results  Component Value Date   WBC 5.3 08/11/2015   HGB 13.9 08/11/2015   HCT 41.6 08/11/2015   PLT 212.0 08/11/2015     GLUCOSE 96 08/11/2015   CHOL 196 08/11/2015   TRIG 74.0 08/11/2015   HDL 65.10 08/11/2015   LDLDIRECT 138.9 02/14/2012   LDLCALC 117 (H) 08/11/2015   ALT 10 08/11/2015   AST 12 08/11/2015   NA 139 08/11/2015   K 4.3 08/11/2015   CL 107 08/11/2015   CREATININE 0.76 08/11/2015   BUN 18 08/11/2015   CO2 27 08/11/2015   TSH 2.73 08/11/2015   HGBA1C 5.6 05/23/2006    US Breast Ltd Uni Left Inc Axilla  Result Date: 06/09/2016 CLINICAL DATA:  Left breast upper outer quadrant area of palpable concern felt by the patient 3 days ago. EXAM: 2D DIGITAL DIAGNOSTIC LEFT MAMMOGRAM WITH CAD AND ADJUNCT TOMO ULTRASOUND LEFT BREAST COMPARISON:  Previous exam(s). ACR Breast Density Category c: The breast tissue is heterogeneously dense, which may obscure small masses. FINDINGS: Mammographically, there are no suspicious masses, areas of architectural distortion or microcalcifications in the left breast. The area of palpable concern in the upper outer quadrant is marked by a BB. Mammographic images were processed with CAD. On physical exam, there is a soft mobile palpable mass measuring approximately 1.5 in the left breast upper outer quadrant, middle depth. Targeted ultrasound is performed, showing no suspicious masses or shadowing lesions. IMPRESSION: No mammographic sonographic evidence of malignancy in the left breast. RECOMMENDATION: Further management of patient's area of palpable concern in her left breast should be based on clinical grounds. Patient is recommended to see a breast surgeon due to the palpable nature of this abnormality. Otherwise, screening mammogram is recommended in 1 year. I have discussed the findings and recommendations with the patient. Results were also provided in writing at the conclusion of the visit. If applicable, a reminder letter will be sent to the patient regarding the next appointment. BI-RADS CATEGORY  1: Negative. Electronically Signed   By: Fidela Salisbury M.D.   On:  06/09/2016 15:25   Mm Diag Breast Tomo Uni Left  Result Date: 06/09/2016 CLINICAL DATA:  Left breast upper outer quadrant area of palpable concern felt by the patient 3 days ago. EXAM: 2D DIGITAL DIAGNOSTIC LEFT MAMMOGRAM WITH CAD AND ADJUNCT TOMO ULTRASOUND LEFT BREAST COMPARISON:  Previous exam(s). ACR Breast Density Category c: The breast tissue is heterogeneously dense, which may obscure small masses. FINDINGS: Mammographically, there are no suspicious masses, areas of architectural distortion or microcalcifications in the left breast. The area of palpable concern in the upper outer quadrant is marked by a BB. Mammographic images were processed with CAD. On physical exam, there is a soft mobile palpable mass measuring approximately 1.5 in the left breast upper outer quadrant, middle depth. Targeted ultrasound is performed, showing no suspicious masses or shadowing lesions. IMPRESSION: No mammographic sonographic evidence of malignancy in the left breast. RECOMMENDATION: Further management of patient's area of palpable concern in her left breast should be based on clinical grounds. Patient is recommended to see a breast surgeon due to the palpable nature of this abnormality. Otherwise,  screening mammogram is recommended in 1 year. I have discussed the findings and recommendations with the patient. Results were also provided in writing at the conclusion of the visit. If applicable, a reminder letter will be sent to the patient regarding the next appointment. BI-RADS CATEGORY  1: Negative. Electronically Signed   By: Fidela Salisbury M.D.   On: 06/09/2016 15:25    Assessment & Plan:   There are no diagnoses linked to this encounter. I have discontinued Ms. Norton's estradiol-norethindrone. I am also having her maintain her metoprolol tartrate, Cholecalciferol, clobetasol, meclizine, FLUoxetine, levothyroxine, topiramate, and SUMAtriptan.  No orders of the defined types were placed in this  encounter.    Follow-up: No Follow-up on file.  Walker Kehr, MD

## 2016-06-15 NOTE — Progress Notes (Signed)
Pre visit review using our clinic review tool, if applicable. No additional management support is needed unless otherwise documented below in the visit note. 

## 2016-06-15 NOTE — Assessment & Plan Note (Signed)
We discussed age appropriate health related issues, including available/recomended screening tests and vaccinations. We discussed a need for adhering to healthy diet and exercise. Labs/EKG were reviewed/ordered. All questions were answered.   

## 2016-06-16 ENCOUNTER — Other Ambulatory Visit (INDEPENDENT_AMBULATORY_CARE_PROVIDER_SITE_OTHER): Payer: Commercial Managed Care - PPO

## 2016-06-16 DIAGNOSIS — Z0000 Encounter for general adult medical examination without abnormal findings: Secondary | ICD-10-CM | POA: Diagnosis not present

## 2016-06-16 LAB — LIPID PANEL
Cholesterol: 179 mg/dL (ref 0–200)
HDL: 58.1 mg/dL (ref >39.00)
LDL Cholesterol: 98 mg/dL (ref 0–99)
NonHDL: 121.25
Total CHOL/HDL Ratio: 3
Triglycerides: 115 mg/dL (ref 0.0–149.0)
VLDL: 23 mg/dL (ref 0.0–40.0)

## 2016-06-16 LAB — URINALYSIS
Bilirubin Urine: NEGATIVE
HGB URINE DIPSTICK: NEGATIVE
Ketones, ur: NEGATIVE
LEUKOCYTES UA: NEGATIVE
NITRITE: NEGATIVE
Specific Gravity, Urine: 1.015 (ref 1.000–1.030)
Total Protein, Urine: NEGATIVE
Urine Glucose: NEGATIVE
Urobilinogen, UA: 0.2 (ref 0.0–1.0)
pH: 7.5 (ref 5.0–8.0)

## 2016-06-16 LAB — BASIC METABOLIC PANEL
BUN: 13 mg/dL (ref 6–23)
CALCIUM: 9.2 mg/dL (ref 8.4–10.5)
CO2: 27 meq/L (ref 19–32)
CREATININE: 0.8 mg/dL (ref 0.40–1.20)
Chloride: 108 mEq/L (ref 96–112)
GFR: 77.92 mL/min (ref >60.00)
GLUCOSE: 108 mg/dL — AB (ref 70–99)
Potassium: 4 mEq/L (ref 3.5–5.1)
Sodium: 140 mEq/L (ref 135–145)

## 2016-06-16 LAB — CBC WITH DIFFERENTIAL/PLATELET
BASOS ABS: 0.1 10*3/uL (ref 0.0–0.1)
Basophils Relative: 1.6 % (ref 0.0–3.0)
EOS ABS: 0.2 10*3/uL (ref 0.0–0.7)
Eosinophils Relative: 3.4 % (ref 0.0–5.0)
HCT: 40.8 % (ref 36.0–46.0)
Hemoglobin: 13.7 g/dL (ref 12.0–15.0)
LYMPHS ABS: 1.7 10*3/uL (ref 0.7–4.0)
Lymphocytes Relative: 31.2 % (ref 12.0–46.0)
MCHC: 33.5 g/dL (ref 30.0–36.0)
MCV: 81.2 fl (ref 78.0–100.0)
MONO ABS: 0.6 10*3/uL (ref 0.1–1.0)
Monocytes Relative: 11 % (ref 3.0–12.0)
NEUTROS ABS: 2.9 10*3/uL (ref 1.4–7.7)
NEUTROS PCT: 52.8 % (ref 43.0–77.0)
PLATELETS: 257 10*3/uL (ref 150.0–400.0)
RBC: 5.02 Mil/uL (ref 3.87–5.11)
RDW: 13.9 % (ref 11.5–15.5)
WBC: 5.5 10*3/uL (ref 4.0–10.5)

## 2016-06-16 LAB — HEPATIC FUNCTION PANEL
ALT: 9 U/L (ref 0–35)
AST: 12 U/L (ref 0–37)
Albumin: 4.1 g/dL (ref 3.5–5.2)
Alkaline Phosphatase: 55 U/L (ref 39–117)
BILIRUBIN DIRECT: 0.1 mg/dL (ref 0.0–0.3)
BILIRUBIN TOTAL: 0.8 mg/dL (ref 0.2–1.2)
Total Protein: 7 g/dL (ref 6.0–8.3)

## 2016-06-16 LAB — TSH: TSH: 4.2 u[IU]/mL (ref 0.35–4.50)

## 2016-06-19 ENCOUNTER — Other Ambulatory Visit: Payer: Self-pay | Admitting: Internal Medicine

## 2016-06-29 ENCOUNTER — Encounter: Payer: Self-pay | Admitting: Gynecology

## 2016-08-20 ENCOUNTER — Other Ambulatory Visit: Payer: Self-pay | Admitting: Internal Medicine

## 2016-09-27 ENCOUNTER — Encounter: Payer: Self-pay | Admitting: Women's Health

## 2016-09-27 ENCOUNTER — Ambulatory Visit (INDEPENDENT_AMBULATORY_CARE_PROVIDER_SITE_OTHER): Payer: Commercial Managed Care - PPO | Admitting: Women's Health

## 2016-09-27 VITALS — BP 138/80 | Ht 67.0 in | Wt 152.0 lb

## 2016-09-27 DIAGNOSIS — N898 Other specified noninflammatory disorders of vagina: Secondary | ICD-10-CM

## 2016-09-27 DIAGNOSIS — Z01419 Encounter for gynecological examination (general) (routine) without abnormal findings: Secondary | ICD-10-CM

## 2016-09-27 DIAGNOSIS — R350 Frequency of micturition: Secondary | ICD-10-CM | POA: Diagnosis not present

## 2016-09-27 LAB — URINALYSIS W MICROSCOPIC + REFLEX CULTURE
BACTERIA UA: NONE SEEN [HPF]
Bilirubin Urine: NEGATIVE
CASTS: NONE SEEN [LPF]
CRYSTALS: NONE SEEN [HPF]
Glucose, UA: NEGATIVE
Hgb urine dipstick: NEGATIVE
Ketones, ur: NEGATIVE
Leukocytes, UA: NEGATIVE
Nitrite: NEGATIVE
PROTEIN: NEGATIVE
RBC / HPF: NONE SEEN RBC/HPF (ref <=2)
SPECIFIC GRAVITY, URINE: 1.015 (ref 1.001–1.035)
WBC, UA: NONE SEEN WBC/HPF (ref <=5)
YEAST: NONE SEEN [HPF]
pH: 8.5 — ABNORMAL HIGH (ref 5.0–8.0)

## 2016-09-27 LAB — WET PREP FOR TRICH, YEAST, CLUE
CLUE CELLS WET PREP: NONE SEEN
Trich, Wet Prep: NONE SEEN
YEAST WET PREP: NONE SEEN

## 2016-09-27 NOTE — Patient Instructions (Signed)
[facility] [phone] Health Maintenance for Postmenopausal Women Menopause is a normal process in which your reproductive ability comes to an end. This process happens gradually over a span of months to years, usually between the ages of 55 and 86. Menopause is complete when you have missed 12 consecutive menstrual periods. It is important to talk with your health care provider about some of the most common conditions that affect postmenopausal women, such as heart disease, cancer, and bone loss (osteoporosis). Adopting a healthy lifestyle and getting preventive care can help to promote your health and wellness. Those actions can also lower your chances of developing some of these common conditions. What should I know about menopause? During menopause, you may experience a number of symptoms, such as:  Moderate-to-severe hot flashes.  Night sweats.  Decrease in sex drive.  Mood swings.  Headaches.  Tiredness.  Irritability.  Memory problems.  Insomnia.  Choosing to treat or not to treat menopausal changes is an individual decision that you make with your health care provider. What should I know about hormone replacement therapy and supplements? Hormone therapy products are effective for treating symptoms that are associated with menopause, such as hot flashes and night sweats. Hormone replacement carries certain risks, especially as you become older. If you are thinking about using estrogen or estrogen with progestin treatments, discuss the benefits and risks with your health care provider. What should I know about heart disease and stroke? Heart disease, heart attack, and stroke become more likely as you age. This may be due, in part, to the hormonal changes that your body experiences during menopause. These can affect how your body processes dietary fats, triglycerides, and cholesterol. Heart attack and stroke are both medical emergencies. There are many things that you can do to  help prevent heart disease and stroke:  Have your blood pressure checked at least every 1-2 years. High blood pressure causes heart disease and increases the risk of stroke.  If you are 56-7 years old, ask your health care provider if you should take aspirin to prevent a heart attack or a stroke.  Do not use any tobacco products, including cigarettes, chewing tobacco, or electronic cigarettes. If you need help quitting, ask your health care provider.  It is important to eat a healthy diet and maintain a healthy weight. ? Be sure to include plenty of vegetables, fruits, low-fat dairy products, and lean protein. ? Avoid eating foods that are high in solid fats, added sugars, or salt (sodium).  Get regular exercise. This is one of the most important things that you can do for your health. ? Try to exercise for at least 150 minutes each week. The type of exercise that you do should increase your heart rate and make you sweat. This is known as moderate-intensity exercise. ? Try to do strengthening exercises at least twice each week. Do these in addition to the moderate-intensity exercise.  Know your numbers.Ask your health care provider to check your cholesterol and your blood glucose. Continue to have your blood tested as directed by your health care provider.  What should I know about cancer screening? There are several types of cancer. Take the following steps to reduce your risk and to catch any cancer development as early as possible. Breast Cancer  Practice breast self-awareness. ? This means understanding how your breasts normally appear and feel. ? It also means doing regular breast self-exams. Let your health care provider know about any changes, no matter how small.  If  you are 41 or older, have a clinician do a breast exam (clinical breast exam or CBE) every year. Depending on your age, family history, and medical history, it may be recommended that you also have a yearly breast  X-ray (mammogram).  If you have a family history of breast cancer, talk with your health care provider about genetic screening.  If you are at high risk for breast cancer, talk with your health care provider about having an MRI and a mammogram every year.  Breast cancer (BRCA) gene test is recommended for women who have family members with BRCA-related cancers. Results of the assessment will determine the need for genetic counseling and BRCA1 and for BRCA2 testing. BRCA-related cancers include these types: ? Breast. This occurs in males or females. ? Ovarian. ? Tubal. This may also be called fallopian tube cancer. ? Cancer of the abdominal or pelvic lining (peritoneal cancer). ? Prostate. ? Pancreatic.  Cervical, Uterine, and Ovarian Cancer Your health care provider may recommend that you be screened regularly for cancer of the pelvic organs. These include your ovaries, uterus, and vagina. This screening involves a pelvic exam, which includes checking for microscopic changes to the surface of your cervix (Pap test).  For women ages 21-65, health care providers may recommend a pelvic exam and a Pap test every three years. For women ages 46-65, they may recommend the Pap test and pelvic exam, combined with testing for human papilloma virus (HPV), every five years. Some types of HPV increase your risk of cervical cancer. Testing for HPV may also be done on women of any age who have unclear Pap test results.  Other health care providers may not recommend any screening for nonpregnant women who are considered low risk for pelvic cancer and have no symptoms. Ask your health care provider if a screening pelvic exam is right for you.  If you have had past treatment for cervical cancer or a condition that could lead to cancer, you need Pap tests and screening for cancer for at least 20 years after your treatment. If Pap tests have been discontinued for you, your risk factors (such as having a new sexual  partner) need to be reassessed to determine if you should start having screenings again. Some women have medical problems that increase the chance of getting cervical cancer. In these cases, your health care provider may recommend that you have screening and Pap tests more often.  If you have a family history of uterine cancer or ovarian cancer, talk with your health care provider about genetic screening.  If you have vaginal bleeding after reaching menopause, tell your health care provider.  There are currently no reliable tests available to screen for ovarian cancer.  Lung Cancer Lung cancer screening is recommended for adults 76-72 years old who are at high risk for lung cancer because of a history of smoking. A yearly low-dose CT scan of the lungs is recommended if you:  Currently smoke.  Have a history of at least 30 pack-years of smoking and you currently smoke or have quit within the past 15 years. A pack-year is smoking an average of one pack of cigarettes per day for one year.  Yearly screening should:  Continue until it has been 15 years since you quit.  Stop if you develop a health problem that would prevent you from having lung cancer treatment.  Colorectal Cancer  This type of cancer can be detected and can often be prevented.  Routine colorectal cancer screening  usually begins at age 59 and continues through age 44.  If you have risk factors for colon cancer, your health care provider may recommend that you be screened at an earlier age.  If you have a family history of colorectal cancer, talk with your health care provider about genetic screening.  Your health care provider may also recommend using home test kits to check for hidden blood in your stool.  A small camera at the end of a tube can be used to examine your colon directly (sigmoidoscopy or colonoscopy). This is done to check for the earliest forms of colorectal cancer.  Direct examination of the colon  should be repeated every 5-10 years until age 58. However, if early forms of precancerous polyps or small growths are found or if you have a family history or genetic risk for colorectal cancer, you may need to be screened more often.  Skin Cancer  Check your skin from head to toe regularly.  Monitor any moles. Be sure to tell your health care provider: ? About any new moles or changes in moles, especially if there is a change in a mole's shape or color. ? If you have a mole that is larger than the size of a pencil eraser.  If any of your family members has a history of skin cancer, especially at a young age, talk with your health care provider about genetic screening.  Always use sunscreen. Apply sunscreen liberally and repeatedly throughout the day.  Whenever you are outside, protect yourself by wearing long sleeves, pants, a wide-brimmed hat, and sunglasses.  What should I know about osteoporosis? Osteoporosis is a condition in which bone destruction happens more quickly than new bone creation. After menopause, you may be at an increased risk for osteoporosis. To help prevent osteoporosis or the bone fractures that can happen because of osteoporosis, the following is recommended:  If you are 10-79 years old, get at least 1,000 mg of calcium and at least 600 mg of vitamin D per day.  If you are older than age 57 but younger than age 63, get at least 1,200 mg of calcium and at least 600 mg of vitamin D per day.  If you are older than age 61, get at least 1,200 mg of calcium and at least 800 mg of vitamin D per day.  Smoking and excessive alcohol intake increase the risk of osteoporosis. Eat foods that are rich in calcium and vitamin D, and do weight-bearing exercises several times each week as directed by your health care provider. What should I know about how menopause affects my mental health? Depression may occur at any age, but it is more common as you become older. Common symptoms of  depression include:  Low or sad mood.  Changes in sleep patterns.  Changes in appetite or eating patterns.  Feeling an overall lack of motivation or enjoyment of activities that you previously enjoyed.  Frequent crying spells.  Talk with your health care provider if you think that you are experiencing depression. What should I know about immunizations? It is important that you get and maintain your immunizations. These include:  Tetanus, diphtheria, and pertussis (Tdap) booster vaccine.  Influenza every year before the flu season begins.  Pneumonia vaccine.  Shingles vaccine.  Your health care provider may also recommend other immunizations. This information is not intended to replace advice given to you by your health care provider. Make sure you discuss any questions you have with your health care provider.  Document Released: 03/25/2005 Document Revised: 08/21/2015 Document Reviewed: 11/04/2014 Elsevier Interactive Patient Education  2018 [employer].

## 2016-09-27 NOTE — Progress Notes (Signed)
Jamie Roberts 1956-01-10 [id number]    History:    Presents for annual exam.  Postmenopausal with no bleeding on no HRT. Not sexually active in years. Normal Pap and mammogram history. 2016 negative colonoscopy. Primary care manages hypothyroid, GERD and anxiety and depression. Returned from a vacation, had numerous delays,  flight home  total 24 hours, now having some vaginal irritation without visible discharge or urinary symptoms.  Past medical history, past surgical history, family history and social history were all reviewed and documented in the EPIC chart. 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 Son doing well. 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  ROS:  A ROS was performed and pertinent positives and negatives are included.  Exam:  Vitals:   09/27/16 1505  BP: 138/80  Weight: 152 lb (68.9 kg)  Height: 5\' 7"  (1.702 m)   Body mass index is 23.81 kg/m.   General appearance:  Normal Thyroid:  Symmetrical, normal in size, without palpable masses or nodularity. Respiratory  Auscultation:  Clear without wheezing or rhonchi Cardiovascular  Auscultation:  Regular rate, without rubs, murmurs or gallops  Edema/varicosities:  Not grossly evident Abdominal  Soft,nontender, without masses, guarding or rebound.  Liver/spleen:  No organomegaly noted  Hernia:  None appreciated  Skin  Inspection:  Grossly normal   Breasts: Examined lying and sitting.     Right: Without masses, retractions, discharge or axillary adenopathy.     Left: Without masses, retractions, discharge or axillary adenopathy. Gentitourinary   Inguinal/mons:  Normal without inguinal adenopathy  External genitalia:  Normal  BUS/Urethra/Skene's glands:  Normal  Vagina:  Normal  Cervix:  Normal  Uterus:   normal in size, shape and contour.  Midline and mobile  Adnexa/parametria:     Rt: Without masses or tenderness.   Lt: Without masses or tenderness.  Anus and perineum: Normal  Digital rectal  exam: Normal sphincter tone without palpated masses or tenderness  Assessment/Plan:  61 y.o. DW F G1 P1  for annual exam with complaint of mild vaginal irritation.  Postmenopausal/no HRT/no bleeding Hypothyroid/GERD/anxiety and depression-primary care manages labs and meds  Plan: Reviewed normality of wet prep and UA both negative, loose clothing, A and D ointment externally.. SBE's, continue annual screening mammogram due to instructed to schedule. Calcium rich diet, vitamin D 2000 daily encouraged. Exercise, home safety and fall prevention discussed. DEXA, instructed to schedule. Pap normal 2017, new screening guidelines reviewed.    [place], 3:42 PM 09/27/2016

## 2016-11-02 ENCOUNTER — Encounter: Payer: Self-pay | Admitting: Women's Health

## 2016-11-05 ENCOUNTER — Other Ambulatory Visit: Payer: Self-pay | Admitting: Internal Medicine

## 2017-05-17 ENCOUNTER — Other Ambulatory Visit: Payer: Self-pay | Admitting: Internal Medicine

## 2017-06-07 ENCOUNTER — Ambulatory Visit: Payer: 59 | Admitting: Internal Medicine

## 2017-06-07 ENCOUNTER — Other Ambulatory Visit (INDEPENDENT_AMBULATORY_CARE_PROVIDER_SITE_OTHER): Payer: 59

## 2017-06-07 ENCOUNTER — Encounter: Payer: Self-pay | Admitting: Internal Medicine

## 2017-06-07 DIAGNOSIS — R030 Elevated blood-pressure reading, without diagnosis of hypertension: Secondary | ICD-10-CM | POA: Diagnosis not present

## 2017-06-07 DIAGNOSIS — G4489 Other headache syndrome: Secondary | ICD-10-CM | POA: Diagnosis not present

## 2017-06-07 DIAGNOSIS — F330 Major depressive disorder, recurrent, mild: Secondary | ICD-10-CM | POA: Diagnosis not present

## 2017-06-07 LAB — CBC WITH DIFFERENTIAL/PLATELET
BASOS PCT: 2.7 % (ref 0.0–3.0)
Basophils Absolute: 0.2 10*3/uL — ABNORMAL HIGH (ref 0.0–0.1)
EOS PCT: 1.5 % (ref 0.0–5.0)
Eosinophils Absolute: 0.1 10*3/uL (ref 0.0–0.7)
HCT: 40.6 % (ref 36.0–46.0)
Hemoglobin: 13.7 g/dL (ref 12.0–15.0)
LYMPHS ABS: 1.9 10*3/uL (ref 0.7–4.0)
Lymphocytes Relative: 28.6 % (ref 12.0–46.0)
MCHC: 33.8 g/dL (ref 30.0–36.0)
MCV: 82.5 fl (ref 78.0–100.0)
MONO ABS: 0.5 10*3/uL (ref 0.1–1.0)
Monocytes Relative: 8.4 % (ref 3.0–12.0)
NEUTROS PCT: 58.8 % (ref 43.0–77.0)
Neutro Abs: 3.8 10*3/uL (ref 1.4–7.7)
Platelets: 239 10*3/uL (ref 150.0–400.0)
RBC: 4.93 Mil/uL (ref 3.87–5.11)
RDW: 14.5 % (ref 11.5–15.5)
WBC: 6.5 10*3/uL (ref 4.0–10.5)

## 2017-06-07 LAB — URINALYSIS
Bilirubin Urine: NEGATIVE
Hgb urine dipstick: NEGATIVE
Ketones, ur: NEGATIVE
LEUKOCYTES UA: NEGATIVE
Nitrite: NEGATIVE
Specific Gravity, Urine: 1.01 (ref 1.000–1.030)
Total Protein, Urine: NEGATIVE
URINE GLUCOSE: NEGATIVE
UROBILINOGEN UA: 0.2 (ref 0.0–1.0)
pH: 7 (ref 5.0–8.0)

## 2017-06-07 LAB — HEPATIC FUNCTION PANEL
ALBUMIN: 4.1 g/dL (ref 3.5–5.2)
ALK PHOS: 59 U/L (ref 39–117)
ALT: 11 U/L (ref 0–35)
AST: 15 U/L (ref 0–37)
Bilirubin, Direct: 0.1 mg/dL (ref 0.0–0.3)
Total Bilirubin: 0.7 mg/dL (ref 0.2–1.2)
Total Protein: 7 g/dL (ref 6.0–8.3)

## 2017-06-07 LAB — BASIC METABOLIC PANEL
BUN: 14 mg/dL (ref 6–23)
CALCIUM: 9.1 mg/dL (ref 8.4–10.5)
CO2: 27 mEq/L (ref 19–32)
Chloride: 105 mEq/L (ref 96–112)
Creatinine, Ser: 0.8 mg/dL (ref 0.40–1.20)
GFR: 77.67 mL/min (ref >60.00)
Glucose, Bld: 90 mg/dL (ref 70–99)
Potassium: 3.6 mEq/L (ref 3.5–5.1)
SODIUM: 137 meq/L (ref 135–145)

## 2017-06-07 LAB — LIPID PANEL
CHOLESTEROL: 178 mg/dL (ref 0–200)
HDL: 57.8 mg/dL (ref >39.00)
LDL Cholesterol: 105 mg/dL — ABNORMAL HIGH (ref 0–99)
NONHDL: 119.85
Total CHOL/HDL Ratio: 3
Triglycerides: 75 mg/dL (ref 0.0–149.0)
VLDL: 15 mg/dL (ref 0.0–40.0)

## 2017-06-07 LAB — SEDIMENTATION RATE: Sed Rate: 17 mm/hr (ref 0–30)

## 2017-06-07 LAB — TSH: TSH: 3.62 u[IU]/mL (ref 0.35–4.50)

## 2017-06-07 MED ORDER — METHYLPREDNISOLONE 4 MG PO TBPK: ORAL_TABLET | ORAL | 0 refills | Status: DC

## 2017-06-07 MED ORDER — PROPRANOLOL HCL 10 MG PO TABS: 10.0000 mg | ORAL_TABLET | Freq: Two times a day (BID) | ORAL | 3 refills | Status: DC

## 2017-06-07 MED ORDER — SUMATRIPTAN SUCCINATE 100 MG PO TABS: ORAL_TABLET | ORAL | 5 refills | Status: DC

## 2017-06-07 MED ORDER — FLUOXETINE HCL 20 MG PO CAPS: 20.0000 mg | ORAL_CAPSULE | Freq: Every day | ORAL | 3 refills | Status: DC

## 2017-06-07 MED ORDER — LEVOTHYROXINE SODIUM 50 MCG PO TABS: 50.0000 ug | ORAL_TABLET | Freq: Every day | ORAL | 3 refills | Status: DC

## 2017-06-07 MED ORDER — VALACYCLOVIR HCL 1 G PO TABS: 1000.0000 mg | ORAL_TABLET | Freq: Three times a day (TID) | ORAL | 0 refills | Status: DC

## 2017-06-07 NOTE — Progress Notes (Signed)
Subjective:  Patient ID: Jamie Roberts, female    DOB: [birth year]  Age: 62 y.o. MRN: [id number]  CC: No chief complaint on file.   HPI Jamie Roberts presents for R HAs every night  - stabbing pain w/nausea - different from migraines x hours. Imitrex and advil help.  Outpatient Medications Prior to Visit  Medication Sig Dispense Refill  . Cholecalciferol 5000 units TABS Take 1 tablet by mouth once a week.    . clobetasol (OLUX) 0.05 % topical foam Apply topically 2 (two) times daily as needed. Please dispense 3 mo supply. 300 g 1  . FLUoxetine (PROZAC) 20 MG capsule TAKE 1 CAPSULE (20 MG TOTAL) BY MOUTH DAILY. 90 capsule 3  . levothyroxine (SYNTHROID, LEVOTHROID) 50 MCG tablet Take 1 tablet (50 mcg total) by mouth daily. 90 tablet 3  . meclizine (ANTIVERT) 12.5 MG tablet Take 1 tablet (12.5 mg total) by mouth 3 (three) times daily as needed for dizziness. 60 tablet 1  . metoprolol tartrate (LOPRESSOR) 25 MG tablet Take 25 mg by mouth as needed.    . SUMAtriptan (IMITREX) 100 MG tablet TAKE 1 TABLET EVERY 2 HOURS AS NEEDED FOR MIGRAINE 12 tablet 2  . topiramate (TOPAMAX) 100 MG tablet TAKE 1 TABLET (100 MG TOTAL) BY MOUTH 2 (TWO) TIMES DAILY. 180 tablet 3   No facility-administered medications prior to visit.     ROS Review of Systems  Constitutional: Negative for activity change, appetite change, chills, fatigue and unexpected weight change.  HENT: Negative for congestion, mouth sores and sinus pressure.   Eyes: Negative for visual disturbance.  Respiratory: Negative for cough and chest tightness.   Gastrointestinal: Negative for abdominal pain and nausea.  Genitourinary: Negative for difficulty urinating, frequency and vaginal pain.  Musculoskeletal: Negative for back pain and gait problem.  Skin: Negative for pallor and rash.  Neurological: Positive for headaches. Negative for dizziness, tremors, weakness and numbness.  Psychiatric/Behavioral: Negative for confusion and sleep  disturbance.    Objective:  BP 132/84 (BP Location: Left Arm, Patient Position: Sitting, Cuff Size: Normal)   Pulse 76   Temp 98.1 F (36.7 C) (Oral)   Ht 5\' 7"  (1.702 m)   Wt 154 lb (69.9 kg)   SpO2 100%   BMI 24.12 kg/m   BP Readings from Last 3 Encounters:  06/07/17 132/84  09/27/16 138/80  06/15/16 124/82    Wt Readings from Last 3 Encounters:  06/07/17 154 lb (69.9 kg)  09/27/16 152 lb (68.9 kg)  06/15/16 150 lb 1.3 oz (68.1 kg)    Physical Exam  Constitutional: She appears well-developed. No distress.  HENT:  Head: Normocephalic.  Right Ear: External ear normal.  Left Ear: External ear normal.  Nose: Nose normal.  Mouth/Throat: Oropharynx is clear and moist.  Eyes: Pupils are equal, round, and reactive to light. Conjunctivae are normal. Right eye exhibits no discharge. Left eye exhibits no discharge.  Neck: Normal range of motion. Neck supple. No JVD present. No tracheal deviation present. No thyromegaly present.  Cardiovascular: Normal rate, regular rhythm and normal heart sounds.  Pulmonary/Chest: No stridor. No respiratory distress. She has no wheezes.  Abdominal: Soft. Bowel sounds are normal. She exhibits no distension and no mass. There is no tenderness. There is no rebound and no guarding.  Musculoskeletal: She exhibits no edema or tenderness.  Lymphadenopathy:    She has no cervical adenopathy.  Neurological: She displays normal reflexes. No cranial nerve deficit. She exhibits normal muscle tone. Coordination normal.  Skin:  No rash noted. No erythema.  Psychiatric: She has a normal mood and affect. Her behavior is normal. Judgment and thought content normal.    Lab Results  Component Value Date   WBC 5.5 06/16/2016   HGB 13.7 06/16/2016   HCT 40.8 06/16/2016   PLT 257.0 06/16/2016   GLUCOSE 108 (H) 06/16/2016   CHOL 179 06/16/2016   TRIG 115.0 06/16/2016   HDL 58.10 06/16/2016   LDLDIRECT 138.9 02/14/2012   LDLCALC 98 06/16/2016   ALT 9  06/16/2016   AST 12 06/16/2016   NA 140 06/16/2016   K 4.0 06/16/2016   CL 108 06/16/2016   CREATININE 0.80 06/16/2016   BUN 13 06/16/2016   CO2 27 06/16/2016   TSH 4.20 06/16/2016   HGBA1C 5.6 05/23/2006    US Breast Ltd Uni Left Inc Axilla  Result Date: 06/09/2016 CLINICAL DATA:  Left breast upper outer quadrant area of palpable concern felt by the patient 3 days ago. EXAM: 2D DIGITAL DIAGNOSTIC LEFT MAMMOGRAM WITH CAD AND ADJUNCT TOMO ULTRASOUND LEFT BREAST COMPARISON:  Previous exam(s). ACR Breast Density Category c: The breast tissue is heterogeneously dense, which may obscure small masses. FINDINGS: Mammographically, there are no suspicious masses, areas of architectural distortion or microcalcifications in the left breast. The area of palpable concern in the upper outer quadrant is marked by a BB. Mammographic images were processed with CAD. On physical exam, there is a soft mobile palpable mass measuring approximately 1.5 in the left breast upper outer quadrant, middle depth. Targeted ultrasound is performed, showing no suspicious masses or shadowing lesions. IMPRESSION: No mammographic sonographic evidence of malignancy in the left breast. RECOMMENDATION: Further management of patient's area of palpable concern in her left breast should be based on clinical grounds. Patient is recommended to see a breast surgeon due to the palpable nature of this abnormality. Otherwise, screening mammogram is recommended in 1 year. I have discussed the findings and recommendations with the patient. Results were also provided in writing at the conclusion of the visit. If applicable, a reminder letter will be sent to the patient regarding the next appointment. BI-RADS CATEGORY  1: Negative. Electronically Signed   By: Fidela Salisbury M.D.   On: 06/09/2016 15:25   Mm Diag Breast Tomo Uni Left  Result Date: 06/09/2016 CLINICAL DATA:  Left breast upper outer quadrant area of palpable concern felt by the  patient 3 days ago. EXAM: 2D DIGITAL DIAGNOSTIC LEFT MAMMOGRAM WITH CAD AND ADJUNCT TOMO ULTRASOUND LEFT BREAST COMPARISON:  Previous exam(s). ACR Breast Density Category c: The breast tissue is heterogeneously dense, which may obscure small masses. FINDINGS: Mammographically, there are no suspicious masses, areas of architectural distortion or microcalcifications in the left breast. The area of palpable concern in the upper outer quadrant is marked by a BB. Mammographic images were processed with CAD. On physical exam, there is a soft mobile palpable mass measuring approximately 1.5 in the left breast upper outer quadrant, middle depth. Targeted ultrasound is performed, showing no suspicious masses or shadowing lesions. IMPRESSION: No mammographic sonographic evidence of malignancy in the left breast. RECOMMENDATION: Further management of patient's area of palpable concern in her left breast should be based on clinical grounds. Patient is recommended to see a breast surgeon due to the palpable nature of this abnormality. Otherwise, screening mammogram is recommended in 1 year. I have discussed the findings and recommendations with the patient. Results were also provided in writing at the conclusion of the visit. If applicable, a reminder letter  will be sent to the patient regarding the next appointment. BI-RADS CATEGORY  1: Negative. Electronically Signed   By: Fidela Salisbury M.D.   On: 06/09/2016 15:25    Assessment & Plan:   There are no diagnoses linked to this encounter. I am having Jamie Roberts maintain her metoprolol tartrate, Cholecalciferol, meclizine, levothyroxine, clobetasol, topiramate, SUMAtriptan, and FLUoxetine.  No orders of the defined types were placed in this encounter.    Follow-up: No follow-ups on file.  Walker Kehr, MD

## 2017-06-07 NOTE — Assessment & Plan Note (Signed)
Propranolol

## 2017-06-07 NOTE — Assessment & Plan Note (Signed)
Fluoxetine  

## 2017-06-07 NOTE — Assessment & Plan Note (Addendum)
Worse - ?trigeminal neuralgia Labs Medrol dose pack; Valtrex x 7 d Propranolol

## 2017-06-14 IMAGING — MG DIGITAL SCREENING BILATERAL MAMMOGRAM WITH CAD
4 series · 4 of 4 positions shown · non-contrast
Comparison: Previous exam(s).

CLINICAL DATA: Screening.

EXAM:
DIGITAL SCREENING BILATERAL MAMMOGRAM WITH CAD

[R CC]
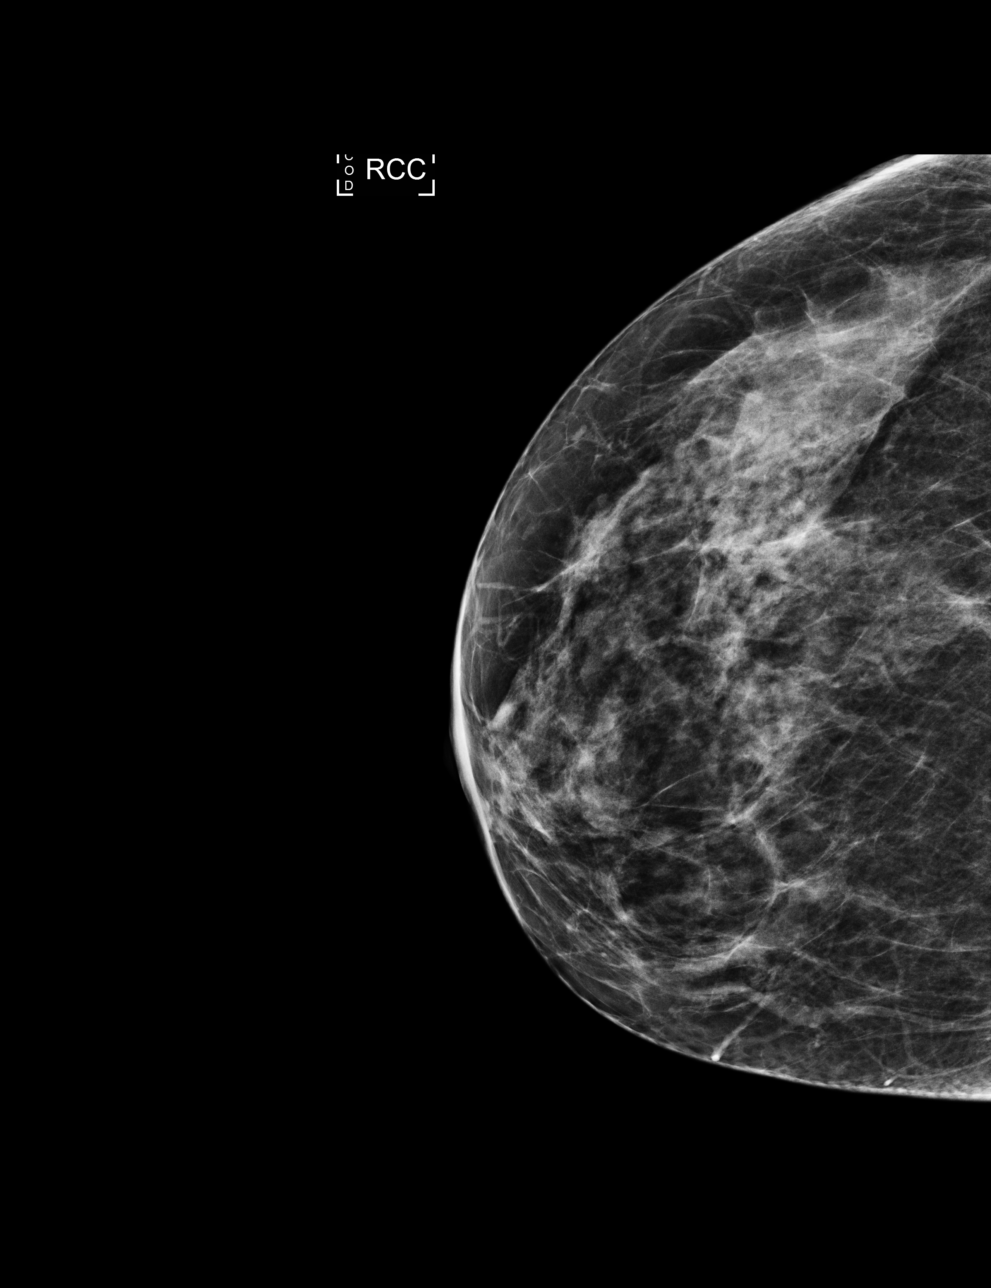

[R MLO]
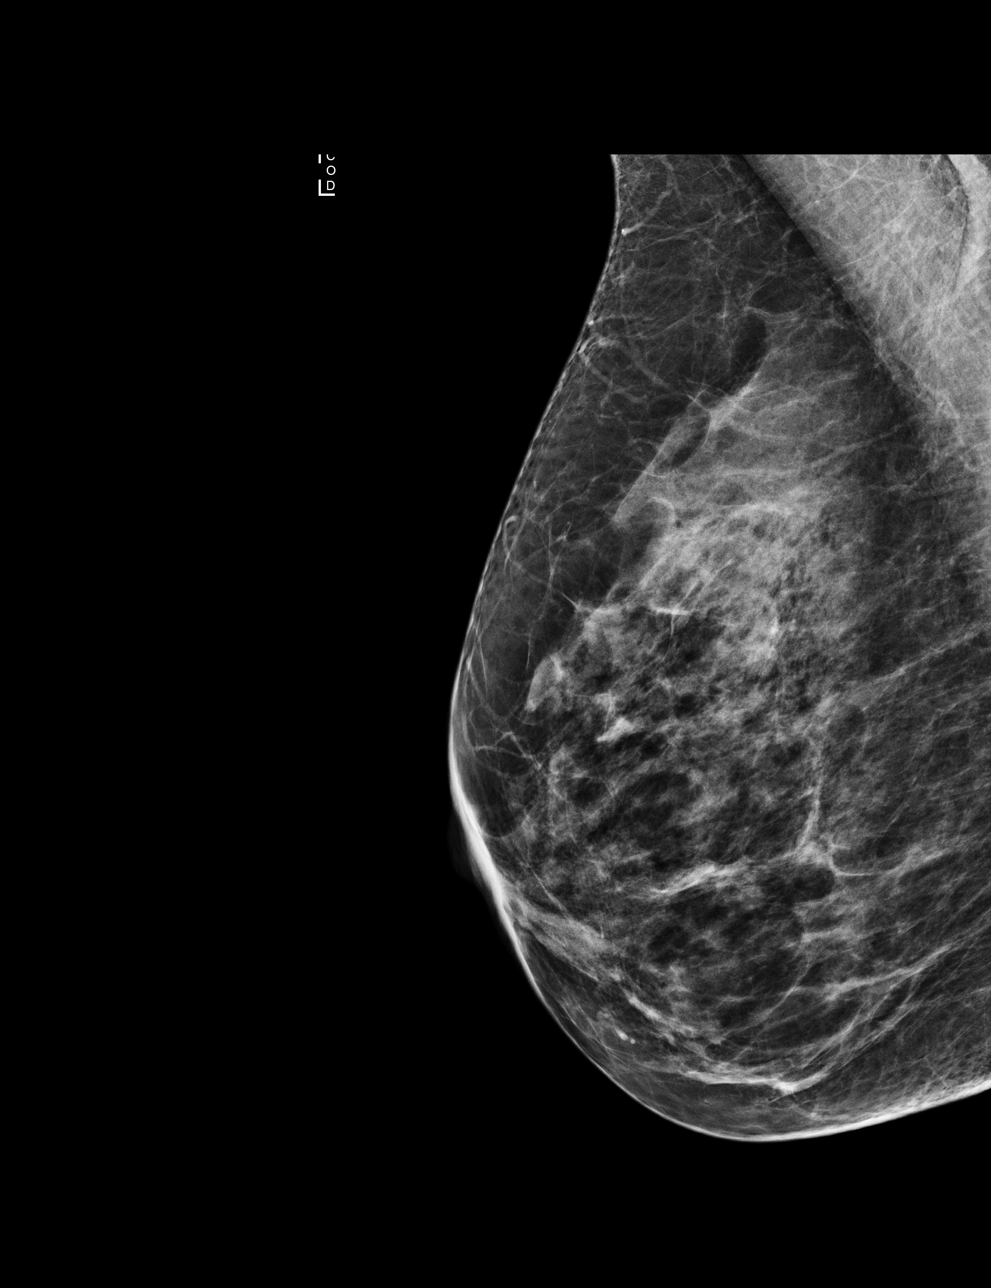

[L MLO]
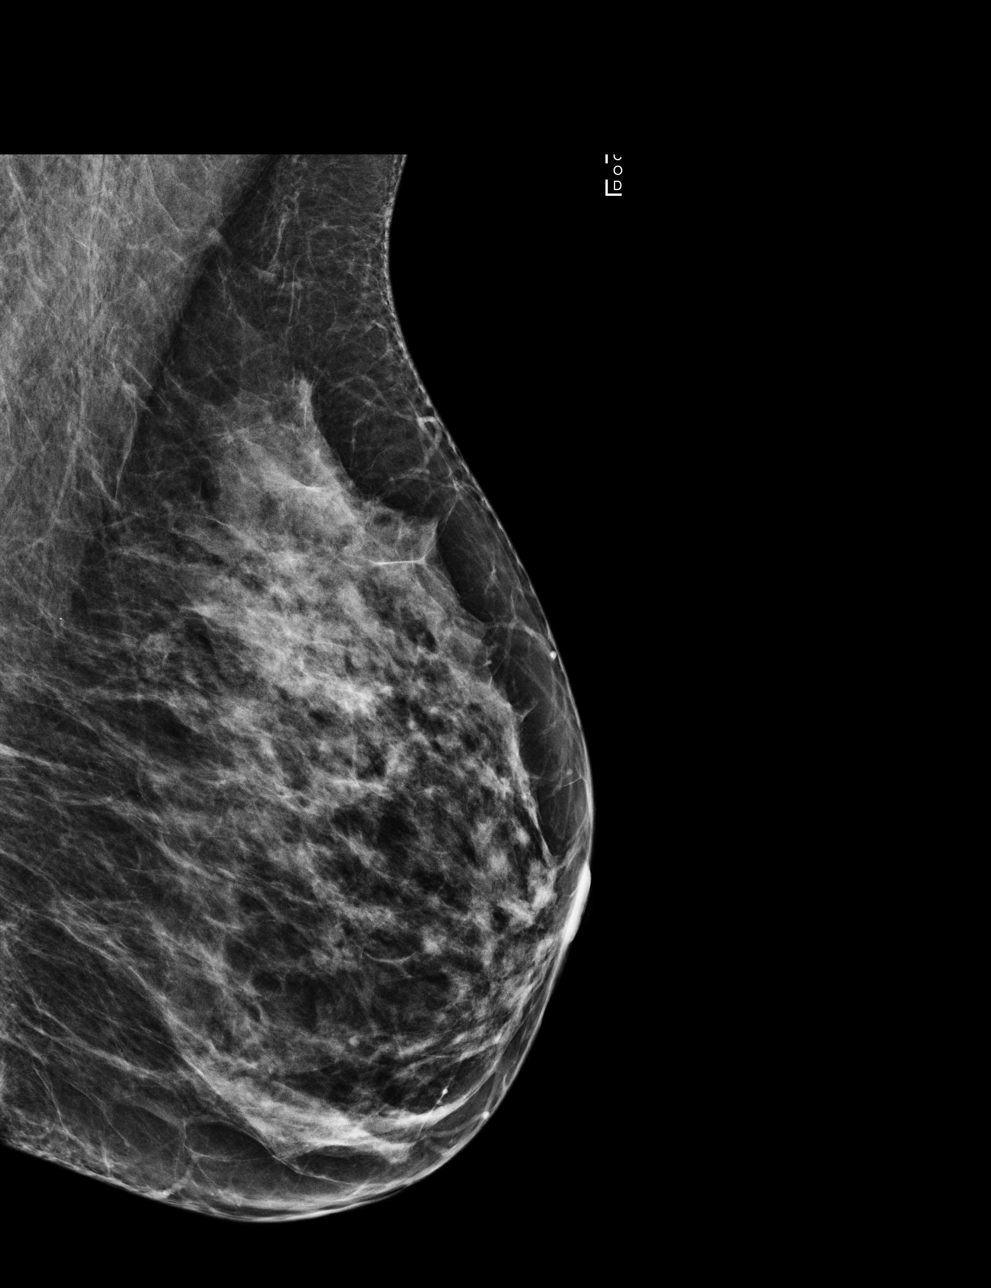

[L CC]
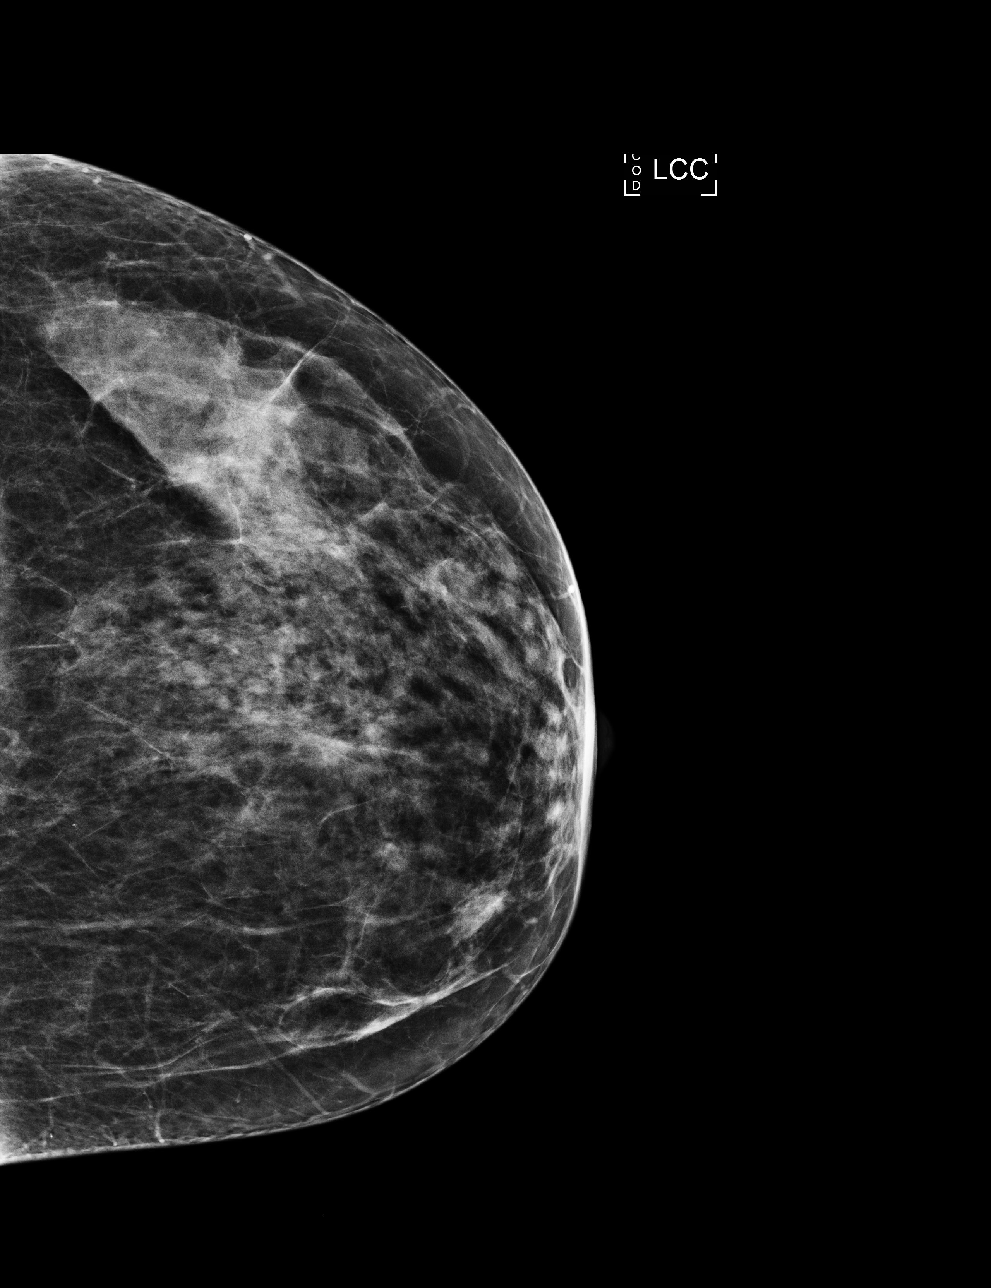

[4 of 4 positions shown; findings below may reference images not displayed]

ACR Breast Density Category c: The breast tissue is heterogeneously
dense, which may obscure small masses.
FINDINGS: There are no findings suspicious for malignancy. Images were
processed with CAD.
IMPRESSION: No mammographic evidence of malignancy. A result letter of this
screening mammogram will be mailed directly to the patient.

RECOMMENDATION:
Screening mammogram in one year. (Code:[id number])

BI-RADS CATEGORY  1: Negative.

## 2017-06-15 ENCOUNTER — Ambulatory Visit: Payer: Commercial Managed Care - PPO | Admitting: Internal Medicine

## 2017-06-17 ENCOUNTER — Other Ambulatory Visit: Payer: Self-pay | Admitting: Internal Medicine

## 2017-08-20 ENCOUNTER — Encounter: Payer: Self-pay | Admitting: Internal Medicine

## 2017-08-21 MED ORDER — CLOBETASOL PROPIONATE 0.05 % EX FOAM: Freq: Two times a day (BID) | CUTANEOUS | 1 refills | Status: DC | PRN

## 2017-09-09 ENCOUNTER — Other Ambulatory Visit: Payer: Self-pay | Admitting: Internal Medicine

## 2017-09-13 ENCOUNTER — Telehealth: Payer: Self-pay

## 2017-09-13 NOTE — Telephone Encounter (Signed)
Received a fax from [facility] that patients plan does not cover clobetasol foam. Insurance prefers Gel or Cream. Please advise. Thank you

## 2017-09-14 NOTE — Telephone Encounter (Signed)
LVM for patient to call back and let us know which one she prefers

## 2017-09-14 NOTE — Telephone Encounter (Signed)
pls ask the pt what she prefers  Thx

## 2018-02-10 ENCOUNTER — Other Ambulatory Visit: Payer: Self-pay | Admitting: Internal Medicine

## 2018-03-08 ENCOUNTER — Other Ambulatory Visit: Payer: Self-pay | Admitting: Internal Medicine

## 2018-03-27 DIAGNOSIS — R0789 Other chest pain: Secondary | ICD-10-CM | POA: Diagnosis not present

## 2018-05-11 ENCOUNTER — Other Ambulatory Visit: Payer: Self-pay | Admitting: Internal Medicine

## 2018-05-14 MED ORDER — FLUOXETINE HCL 20 MG PO CAPS: 20.0000 mg | ORAL_CAPSULE | Freq: Every day | ORAL | 3 refills | Status: DC

## 2018-05-14 MED ORDER — LEVOTHYROXINE SODIUM 50 MCG PO TABS: 50.0000 ug | ORAL_TABLET | Freq: Every day | ORAL | 3 refills | Status: DC

## 2018-05-14 MED ORDER — CLOBETASOL PROPIONATE 0.05 % EX FOAM: Freq: Two times a day (BID) | CUTANEOUS | 1 refills | Status: DC | PRN

## 2018-06-28 DIAGNOSIS — Z961 Presence of intraocular lens: Secondary | ICD-10-CM | POA: Diagnosis not present

## 2018-07-10 ENCOUNTER — Other Ambulatory Visit: Payer: Self-pay | Admitting: Internal Medicine

## 2018-08-09 ENCOUNTER — Other Ambulatory Visit: Payer: Self-pay | Admitting: Internal Medicine

## 2018-08-11 ENCOUNTER — Other Ambulatory Visit: Payer: Self-pay | Admitting: Internal Medicine

## 2018-08-30 ENCOUNTER — Other Ambulatory Visit: Payer: Self-pay | Admitting: Internal Medicine

## 2018-11-20 ENCOUNTER — Ambulatory Visit (INDEPENDENT_AMBULATORY_CARE_PROVIDER_SITE_OTHER): Payer: 59 | Admitting: Internal Medicine

## 2018-11-20 ENCOUNTER — Encounter: Payer: Self-pay | Admitting: Internal Medicine

## 2018-11-20 ENCOUNTER — Other Ambulatory Visit (INDEPENDENT_AMBULATORY_CARE_PROVIDER_SITE_OTHER): Payer: 59

## 2018-11-20 ENCOUNTER — Other Ambulatory Visit: Payer: Self-pay

## 2018-11-20 VITALS — BP 124/82 | HR 73 | Temp 97.8°F | Ht 67.0 in | Wt 154.0 lb

## 2018-11-20 DIAGNOSIS — E039 Hypothyroidism, unspecified: Secondary | ICD-10-CM

## 2018-11-20 DIAGNOSIS — Z0000 Encounter for general adult medical examination without abnormal findings: Secondary | ICD-10-CM

## 2018-11-20 DIAGNOSIS — Z23 Encounter for immunization: Secondary | ICD-10-CM

## 2018-11-20 DIAGNOSIS — F330 Major depressive disorder, recurrent, mild: Secondary | ICD-10-CM | POA: Diagnosis not present

## 2018-11-20 LAB — CBC WITH DIFFERENTIAL/PLATELET
Basophils Absolute: 0.1 10*3/uL (ref 0.0–0.1)
Basophils Relative: 1.1 % (ref 0.0–3.0)
Eosinophils Absolute: 0.2 10*3/uL (ref 0.0–0.7)
Eosinophils Relative: 2.7 % (ref 0.0–5.0)
HCT: 41.9 % (ref 36.0–46.0)
Hemoglobin: 13.6 g/dL (ref 12.0–15.0)
Lymphocytes Relative: 24 % (ref 12.0–46.0)
Lymphs Abs: 1.3 10*3/uL (ref 0.7–4.0)
MCHC: 32.6 g/dL (ref 30.0–36.0)
MCV: 83.6 fl (ref 78.0–100.0)
Monocytes Absolute: 0.6 10*3/uL (ref 0.1–1.0)
Monocytes Relative: 9.8 % (ref 3.0–12.0)
Neutro Abs: 3.5 10*3/uL (ref 1.4–7.7)
Neutrophils Relative %: 62.4 % (ref 43.0–77.0)
Platelets: 242 10*3/uL (ref 150.0–400.0)
RBC: 5.01 Mil/uL (ref 3.87–5.11)
RDW: 14.4 % (ref 11.5–15.5)
WBC: 5.6 10*3/uL (ref 4.0–10.5)

## 2018-11-20 LAB — BASIC METABOLIC PANEL
BUN: 12 mg/dL (ref 6–23)
CO2: 27 mEq/L (ref 19–32)
Calcium: 9.2 mg/dL (ref 8.4–10.5)
Chloride: 105 mEq/L (ref 96–112)
Creatinine, Ser: 0.79 mg/dL (ref 0.40–1.20)
GFR: 73.79 mL/min (ref >60.00)
Glucose, Bld: 97 mg/dL (ref 70–99)
Potassium: 4.1 mEq/L (ref 3.5–5.1)
Sodium: 139 mEq/L (ref 135–145)

## 2018-11-20 LAB — HEPATIC FUNCTION PANEL
ALT: 18 U/L (ref 0–35)
AST: 17 U/L (ref 0–37)
Albumin: 4.2 g/dL (ref 3.5–5.2)
Alkaline Phosphatase: 63 U/L (ref 39–117)
Bilirubin, Direct: 0.1 mg/dL (ref 0.0–0.3)
Total Bilirubin: 0.6 mg/dL (ref 0.2–1.2)
Total Protein: 7.1 g/dL (ref 6.0–8.3)

## 2018-11-20 LAB — URINALYSIS
Bilirubin Urine: NEGATIVE
Hgb urine dipstick: NEGATIVE
Ketones, ur: NEGATIVE
Leukocytes,Ua: NEGATIVE
Nitrite: NEGATIVE
Specific Gravity, Urine: 1.015 (ref 1.000–1.030)
Total Protein, Urine: NEGATIVE
Urine Glucose: NEGATIVE
Urobilinogen, UA: 0.2 (ref 0.0–1.0)
pH: 7.5 (ref 5.0–8.0)

## 2018-11-20 LAB — LIPID PANEL
Cholesterol: 188 mg/dL (ref 0–200)
HDL: 63.7 mg/dL (ref >39.00)
LDL Cholesterol: 107 mg/dL — ABNORMAL HIGH (ref 0–99)
NonHDL: 124.24
Total CHOL/HDL Ratio: 3
Triglycerides: 88 mg/dL (ref 0.0–149.0)
VLDL: 17.6 mg/dL (ref 0.0–40.0)

## 2018-11-20 LAB — T4, FREE: Free T4: 0.99 ng/dL (ref 0.60–1.60)

## 2018-11-20 LAB — T3, FREE: T3, Free: 3.1 pg/mL (ref 2.3–4.2)

## 2018-11-20 LAB — TSH: TSH: 3.18 u[IU]/mL (ref 0.35–4.50)

## 2018-11-20 MED ORDER — SUMATRIPTAN SUCCINATE 100 MG PO TABS: ORAL_TABLET | ORAL | 5 refills | Status: DC

## 2018-11-20 MED ORDER — VITAMIN D3 50 MCG (2000 UT) PO CAPS: 2000.0000 [IU] | ORAL_CAPSULE | Freq: Every day | ORAL | 3 refills | Status: AC

## 2018-11-20 MED ORDER — LEVOTHYROXINE SODIUM 50 MCG PO TABS: 50.0000 ug | ORAL_TABLET | Freq: Every day | ORAL | 3 refills | Status: DC

## 2018-11-20 MED ORDER — FLUOXETINE HCL 20 MG PO CAPS: 20.0000 mg | ORAL_CAPSULE | Freq: Every day | ORAL | 3 refills | Status: DC

## 2018-11-20 NOTE — Assessment & Plan Note (Signed)
We discussed age appropriate health related issues, including available/recomended screening tests and vaccinations. We discussed a need for adhering to healthy diet and exercise. Labs/EKG were reviewed/ordered. All questions were answered. CT calc score offered 2020 Colon due in 2026 Dr Henrene Pastor Vaccines are up up to date Flu, Shingrix

## 2018-11-20 NOTE — Assessment & Plan Note (Signed)
Labs

## 2018-11-20 NOTE — Patient Instructions (Signed)
Cardiac CT calcium scoring test $150   Computed tomography, more commonly known as a CT or CAT scan, is a diagnostic medical imaging test. Like traditional x-rays, it produces multiple images or pictures of the inside of the body. The cross-sectional images generated during a CT scan can be reformatted in multiple planes. They can even generate three-dimensional images. These images can be viewed on a computer monitor, printed on film or by a 3D printer, or transferred to a CD or DVD. CT images of internal organs, bones, soft tissue and blood vessels provide greater detail than traditional x-rays, particularly of soft tissues and blood vessels. A cardiac CT scan for coronary calcium is a non-invasive way of obtaining information about the presence, location and extent of calcified plaque in the coronary arteries-the vessels that supply oxygen-containing blood to the heart muscle. Calcified plaque results when there is a build-up of fat and other substances under the inner layer of the artery. This material can calcify which signals the presence of atherosclerosis, a disease of the vessel wall, also called coronary artery disease (CAD). People with this disease have an increased risk for heart attacks. In addition, over time, progression of plaque build up (CAD) can narrow the arteries or even close off blood flow to the heart. The result may be chest pain, sometimes called "angina," or a heart attack. Because calcium is a marker of CAD, the amount of calcium detected on a cardiac CT scan is a helpful prognostic tool. The findings on cardiac CT are expressed as a calcium score. Another name for this test is coronary artery calcium scoring.  What are some common uses of the procedure? The goal of cardiac CT scan for calcium scoring is to determine if CAD is present and to what extent, even if there are no symptoms. It is a screening study that may be recommended by a physician for patients with risk factors  for CAD but no clinical symptoms. The major risk factors for CAD are: . high blood cholesterol levels  . family history of heart attacks  . diabetes  . high blood pressure  . cigarette smoking  . overweight or obese  . physical inactivity   A negative cardiac CT scan for calcium scoring shows no calcification within the coronary arteries. This suggests that CAD is absent or so minimal it cannot be seen by this technique. The chance of having a heart attack over the next two to five years is very low under these circumstances. A positive test means that CAD is present, regardless of whether or not the patient is experiencing any symptoms. The amount of calcification-expressed as the calcium score-may help to predict the likelihood of a myocardial infarction (heart attack) in the coming years and helps your medical doctor or cardiologist decide whether the patient may need to take preventive medicine or undertake other measures such as diet and exercise to lower the risk for heart attack. The extent of CAD is graded according to your calcium score:  Calcium Score  Presence of CAD (coronary artery disease)  0 No evidence of CAD   1-10 Minimal evidence of CAD  11-100 Mild evidence of CAD  101-400 Moderate evidence of CAD  Over 400 Extensive evidence of CAD    

## 2018-11-20 NOTE — Progress Notes (Signed)
Subjective:  Patient ID: Jamie Roberts, female    DOB: 1955-09-18  Age: 63 y.o. MRN: [id number]  CC: No chief complaint on file.   HPI Jamie Roberts presents for a well exam Working at [employer] in [place] full time F/u depression - worse  Outpatient Medications Prior to Visit  Medication Sig Dispense Refill  . Cholecalciferol 5000 units TABS Take 1 tablet by mouth once a week.    . clobetasol (OLUX) 0.05 % topical foam Apply topically 2 (two) times daily as needed. Please dispense 3 mo supply. 300 g 1  . FLUoxetine (PROZAC) 20 MG capsule Take 1 capsule (20 mg total) by mouth daily. 90 capsule 3  . levothyroxine (SYNTHROID, LEVOTHROID) 50 MCG tablet Take 1 tablet (50 mcg total) by mouth daily. 90 tablet 3  . meclizine (ANTIVERT) 12.5 MG tablet Take 1 tablet (12.5 mg total) by mouth 3 (three) times daily as needed for dizziness. 60 tablet 1  . methylPREDNISolone (MEDROL DOSEPAK) 4 MG TBPK tablet As directed 21 tablet 0  . propranolol (INDERAL) 10 MG tablet Take 1 tablet (10 mg total) by mouth 2 (two) times daily. Patient needs office visit before refills will be given 180 tablet 0  . SUMAtriptan (IMITREX) 100 MG tablet TAKE 1 TABLET EVERY 2 HOURS AS NEEDED FOR MIGRAINE ** INS LIMIT OF 10 ** 12 tablet 5  . topiramate (TOPAMAX) 100 MG tablet TAKE 1 TABLET BY MOUTH 2 TIMES DAILY. ANNUAL APPT IS DUE SEE PROVIDER FOR FUTURE REFILLS 180 tablet 0  . valACYclovir (VALTREX) 1000 MG tablet Take 1 tablet (1,000 mg total) by mouth 3 (three) times daily. 21 tablet 0   No facility-administered medications prior to visit.     ROS: Review of Systems  Constitutional: Negative for activity change, appetite change, chills, fatigue and unexpected weight change.  HENT: Negative for congestion, mouth sores and sinus pressure.   Eyes: Negative for visual disturbance.  Respiratory: Negative for cough and chest tightness.   Gastrointestinal: Negative for abdominal pain and nausea.  Genitourinary: Negative for  difficulty urinating, frequency and vaginal pain.  Musculoskeletal: Negative for back pain and gait problem.  Skin: Negative for pallor and rash.  Neurological: Positive for headaches. Negative for dizziness, tremors, weakness and numbness.  Psychiatric/Behavioral: Positive for dysphoric mood. Negative for confusion, sleep disturbance and suicidal ideas.    Objective:  There were no vitals taken for this visit.  BP Readings from Last 3 Encounters:  06/07/17 132/84  09/27/16 138/80  06/15/16 124/82    Wt Readings from Last 3 Encounters:  06/07/17 154 lb (69.9 kg)  09/27/16 152 lb (68.9 kg)  06/15/16 150 lb 1.3 oz (68.1 kg)    Physical Exam Constitutional:      General: She is not in acute distress.    Appearance: She is well-developed.  HENT:     Head: Normocephalic.     Right Ear: External ear normal.     Left Ear: External ear normal.     Nose: Nose normal.  Eyes:     General:        Right eye: No discharge.        Left eye: No discharge.     Conjunctiva/sclera: Conjunctivae normal.     Pupils: Pupils are equal, round, and reactive to light.  Neck:     Musculoskeletal: Normal range of motion and neck supple.     Thyroid: No thyromegaly.     Vascular: No JVD.     Trachea: No tracheal  deviation.  Cardiovascular:     Rate and Rhythm: Normal rate and regular rhythm.     Heart sounds: Normal heart sounds.  Pulmonary:     Effort: No respiratory distress.     Breath sounds: No stridor. No wheezing.  Abdominal:     General: Bowel sounds are normal. There is no distension.     Palpations: Abdomen is soft. There is no mass.     Tenderness: There is no abdominal tenderness. There is no guarding or rebound.  Musculoskeletal:        General: No tenderness.  Lymphadenopathy:     Cervical: No cervical adenopathy.  Skin:    Findings: No erythema or rash.  Neurological:     Cranial Nerves: No cranial nerve deficit.     Motor: No abnormal muscle tone.     Coordination:  Coordination normal.     Deep Tendon Reflexes: Reflexes normal.  Psychiatric:        Behavior: Behavior normal.        Thought Content: Thought content normal.        Judgment: Judgment normal.     Lab Results  Component Value Date   WBC 6.5 06/07/2017   HGB 13.7 06/07/2017   HCT 40.6 06/07/2017   PLT 239.0 06/07/2017   GLUCOSE 90 06/07/2017   CHOL 178 06/07/2017   TRIG 75.0 06/07/2017   HDL 57.80 06/07/2017   LDLDIRECT 138.9 02/14/2012   LDLCALC 105 (H) 06/07/2017   ALT 11 06/07/2017   AST 15 06/07/2017   NA 137 06/07/2017   K 3.6 06/07/2017   CL 105 06/07/2017   CREATININE 0.80 06/07/2017   BUN 14 06/07/2017   CO2 27 06/07/2017   TSH 3.62 06/07/2017   HGBA1C 5.6 05/23/2006    US Breast [place]  Result Date: 06/09/2016 CLINICAL DATA:  Left breast upper outer quadrant area of palpable concern felt by the patient 3 days ago. EXAM: 2D DIGITAL DIAGNOSTIC LEFT MAMMOGRAM WITH CAD AND ADJUNCT TOMO ULTRASOUND LEFT BREAST COMPARISON:  Previous exam(s). ACR Breast Density Category c: The breast tissue is heterogeneously dense, which may obscure small masses. FINDINGS: Mammographically, there are no suspicious masses, areas of architectural distortion or microcalcifications in the left breast. The area of palpable concern in the upper outer quadrant is marked by a BB. Mammographic images were processed with CAD. On physical exam, there is a soft mobile palpable mass measuring approximately 1.5 in the left breast upper outer quadrant, middle depth. Targeted ultrasound is performed, showing no suspicious masses or shadowing lesions. IMPRESSION: No mammographic sonographic evidence of malignancy in the left breast. RECOMMENDATION: Further management of patient's area of palpable concern in her left breast should be based on clinical grounds. Patient is recommended to see a breast surgeon due to the palpable nature of this abnormality. Otherwise, screening mammogram is  recommended in 1 year. I have discussed the findings and recommendations with the patient. Results were also provided in writing at the conclusion of the visit. If applicable, a reminder letter will be sent to the patient regarding the next appointment. BI-RADS CATEGORY  1: Negative. Electronically Signed   By: Fidela Salisbury M.D.   On: 06/09/2016 15:25   Mm Diag Breast Tom[place] Uni Left  Result Date: 06/09/2016 CLINICAL DATA:  Left breast upper outer quadrant area of palpable concern felt by the patient 3 days ago. EXAM: 2D DIGITAL DIAGNOSTIC LEFT MAMMOGRAM WITH CAD AND ADJUNCT TOMO ULTRASOUND LEFT BREAST COMPARISON:  Previous exam(s).  ACR Breast Density Category c: The breast tissue is heterogeneously dense, which may obscure small masses. FINDINGS: Mammographically, there are no suspicious masses, areas of architectural distortion or microcalcifications in the left breast. The area of palpable concern in the upper outer quadrant is marked by a BB. Mammographic images were processed with CAD. On physical exam, there is a soft mobile palpable mass measuring approximately 1.5 in the left breast upper outer quadrant, middle depth. Targeted ultrasound is performed, showing no suspicious masses or shadowing lesions. IMPRESSION: No mammographic sonographic evidence of malignancy in the left breast. RECOMMENDATION: Further management of patient's area of palpable concern in her left breast should be based on clinical grounds. Patient is recommended to see a breast surgeon due to the palpable nature of this abnormality. Otherwise, screening mammogram is recommended in 1 year. I have discussed the findings and recommendations with the patient. Results were also provided in writing at the conclusion of the visit. If applicable, a reminder letter will be sent to the patient regarding the next appointment. BI-RADS CATEGORY  1: Negative. Electronically Signed   By: Fidela Salisbury M.D.   On: 06/09/2016 15:25     Assessment & Plan:   There are no diagnoses linked to this encounter.   No orders of the defined types were placed in this encounter.    Follow-up: No follow-ups on file.  Walker Kehr, MD

## 2018-11-20 NOTE — Addendum Note (Signed)
Addended by: Karren Cobble on: 11/20/2018 09:22 AM   Modules accepted: Orders

## 2018-11-20 NOTE — Assessment & Plan Note (Signed)
Cont Fluoxetine

## 2019-06-18 ENCOUNTER — Other Ambulatory Visit: Payer: Self-pay | Admitting: Internal Medicine

## 2019-06-18 DIAGNOSIS — N63 Unspecified lump in unspecified breast: Secondary | ICD-10-CM

## 2019-06-20 MED ORDER — PROPRANOLOL HCL 10 MG PO TABS: 10.0000 mg | ORAL_TABLET | Freq: Two times a day (BID) | ORAL | 0 refills | Status: DC

## 2019-06-20 MED ORDER — FLUOXETINE HCL 20 MG PO CAPS: 20.0000 mg | ORAL_CAPSULE | Freq: Every day | ORAL | 3 refills | Status: DC

## 2019-06-20 MED ORDER — SUMATRIPTAN SUCCINATE 100 MG PO TABS: ORAL_TABLET | ORAL | 5 refills | Status: DC

## 2019-06-20 MED ORDER — LEVOTHYROXINE SODIUM 50 MCG PO TABS: 50.0000 ug | ORAL_TABLET | Freq: Every day | ORAL | 3 refills | Status: DC

## 2019-09-11 ENCOUNTER — Other Ambulatory Visit: Payer: Self-pay | Admitting: Internal Medicine

## 2019-11-21 ENCOUNTER — Other Ambulatory Visit: Payer: Self-pay

## 2019-11-21 ENCOUNTER — Ambulatory Visit (INDEPENDENT_AMBULATORY_CARE_PROVIDER_SITE_OTHER): Payer: 59 | Admitting: Internal Medicine

## 2019-11-21 ENCOUNTER — Ambulatory Visit
Admission: RE | Admit: 2019-11-21 | Discharge: 2019-11-21 | Disposition: A | Discharge status: Home / Self Care | Payer: 59 | Source: Ambulatory Visit

## 2019-11-21 ENCOUNTER — Encounter: Payer: Self-pay | Admitting: Internal Medicine

## 2019-11-21 VITALS — BP 142/86 | HR 71 | Temp 98.2°F | Ht 67.0 in | Wt 157.0 lb

## 2019-11-21 DIAGNOSIS — Z1231 Encounter for screening mammogram for malignant neoplasm of breast: Secondary | ICD-10-CM

## 2019-11-21 DIAGNOSIS — Z0000 Encounter for general adult medical examination without abnormal findings: Secondary | ICD-10-CM

## 2019-11-21 DIAGNOSIS — G43809 Other migraine, not intractable, without status migrainosus: Secondary | ICD-10-CM

## 2019-11-21 DIAGNOSIS — Z23 Encounter for immunization: Secondary | ICD-10-CM

## 2019-11-21 LAB — CBC WITH DIFFERENTIAL/PLATELET
Basophils Absolute: 0 10*3/uL (ref 0.0–0.1)
Basophils Relative: 0.5 % (ref 0.0–3.0)
Eosinophils Absolute: 0.2 10*3/uL (ref 0.0–0.7)
Eosinophils Relative: 2.7 % (ref 0.0–5.0)
HCT: 41.9 % (ref 36.0–46.0)
Hemoglobin: 13.7 g/dL (ref 12.0–15.0)
Lymphocytes Relative: 28.6 % (ref 12.0–46.0)
Lymphs Abs: 1.7 10*3/uL (ref 0.7–4.0)
MCHC: 32.7 g/dL (ref 30.0–36.0)
MCV: 81.5 fl (ref 78.0–100.0)
Monocytes Absolute: 0.6 10*3/uL (ref 0.1–1.0)
Monocytes Relative: 9.3 % (ref 3.0–12.0)
Neutro Abs: 3.6 10*3/uL (ref 1.4–7.7)
Neutrophils Relative %: 58.9 % (ref 43.0–77.0)
Platelets: 251 10*3/uL (ref 150.0–400.0)
RBC: 5.14 Mil/uL — ABNORMAL HIGH (ref 3.87–5.11)
RDW: 14.6 % (ref 11.5–15.5)
WBC: 6.1 10*3/uL (ref 4.0–10.5)

## 2019-11-21 LAB — COMPREHENSIVE METABOLIC PANEL
ALT: 14 U/L (ref 0–35)
AST: 16 U/L (ref 0–37)
Albumin: 4.3 g/dL (ref 3.5–5.2)
Alkaline Phosphatase: 69 U/L (ref 39–117)
BUN: 10 mg/dL (ref 6–23)
CO2: 32 mEq/L (ref 19–32)
Calcium: 9.4 mg/dL (ref 8.4–10.5)
Chloride: 103 mEq/L (ref 96–112)
Creatinine, Ser: 0.76 mg/dL (ref 0.40–1.20)
GFR: 83.58 mL/min (ref >60.00)
Glucose, Bld: 92 mg/dL (ref 70–99)
Potassium: 4 mEq/L (ref 3.5–5.1)
Sodium: 140 mEq/L (ref 135–145)
Total Bilirubin: 1.1 mg/dL (ref 0.2–1.2)
Total Protein: 7.4 g/dL (ref 6.0–8.3)

## 2019-11-21 LAB — URINALYSIS
Bilirubin Urine: NEGATIVE
Hgb urine dipstick: NEGATIVE
Ketones, ur: NEGATIVE
Leukocytes,Ua: NEGATIVE
Nitrite: NEGATIVE
Specific Gravity, Urine: 1.015 (ref 1.000–1.030)
Total Protein, Urine: NEGATIVE
Urine Glucose: NEGATIVE
Urobilinogen, UA: 0.2 (ref 0.0–1.0)
pH: 7 (ref 5.0–8.0)

## 2019-11-21 LAB — LIPID PANEL
Cholesterol: 194 mg/dL (ref 0–200)
HDL: 56.4 mg/dL (ref >39.00)
LDL Cholesterol: 119 mg/dL — ABNORMAL HIGH (ref 0–99)
NonHDL: 137.76
Total CHOL/HDL Ratio: 3
Triglycerides: 93 mg/dL (ref 0.0–149.0)
VLDL: 18.6 mg/dL (ref 0.0–40.0)

## 2019-11-21 LAB — TSH: TSH: 4.09 u[IU]/mL (ref 0.35–4.50)

## 2019-11-21 MED ORDER — FLUOXETINE HCL 20 MG PO CAPS
20.0000 mg | ORAL_CAPSULE | Freq: Every day | ORAL | 3 refills | Status: DC
Start: 2019-11-21 — End: 2020-08-18

## 2019-11-21 MED ORDER — LEVOTHYROXINE SODIUM 50 MCG PO TABS
50.0000 ug | ORAL_TABLET | Freq: Every day | ORAL | 3 refills | Status: DC
Start: 2019-11-21 — End: 2020-12-02

## 2019-11-21 MED ORDER — SUMATRIPTAN SUCCINATE 100 MG PO TABS
ORAL_TABLET | ORAL | 5 refills | Status: DC
Start: 2019-11-21 — End: 2020-12-02

## 2019-11-21 NOTE — Assessment & Plan Note (Signed)
Imitrex 

## 2019-11-21 NOTE — Progress Notes (Signed)
   Subjective:  Patient ID: Jamie Roberts, female    DOB: Nov 22, 1955  Age: 64 y.o. MRN: [id number]  CC: Follow-up   HPI Jamie Roberts presents for a well exam  Outpatient Medications Prior to Visit  Medication Sig Dispense Refill  . Cholecalciferol (VITAMIN D3) 50 MCG (2000 UT) capsule Take 1 capsule (2,000 Units total) by mouth daily. 100 capsule 3  . FLUoxetine (PROZAC) 20 MG capsule Take 1 capsule (20 mg total) by mouth daily. 90 capsule 3  . levothyroxine (SYNTHROID) 50 MCG tablet Take 1 tablet (50 mcg total) by mouth daily. 90 tablet 3  . propranolol (INDERAL) 10 MG tablet Take 1 tablet (10 mg total) by mouth 2 (two) times daily. Patient needs office visit before refills will be given 180 tablet 0  . SUMAtriptan (IMITREX) 100 MG tablet TAKE 1 TABLET EVERY 2 HOURS AS NEEDED FOR MIGRAINE ** INS LIMIT OF 10 ** 12 tablet 5   No facility-administered medications prior to visit.    ROS: Review of Systems  Objective:  BP (!) 142/86 (BP Location: Right Arm, Patient Position: Sitting, Cuff Size: Normal)   Pulse 71   Temp 98.2 F (36.8 C) (Oral)   Ht 5\' 7"  (1.702 m)   Wt 157 lb (71.2 kg)   SpO2 99%   BMI 24.59 kg/m   BP Readings from Last 3 Encounters:  11/21/19 (!) 142/86  11/20/18 124/82  06/07/17 132/84    Wt Readings from Last 3 Encounters:  11/21/19 157 lb (71.2 kg)  11/20/18 154 lb (69.9 kg)  06/07/17 154 lb (69.9 kg)    Physical Exam  Lab Results  Component Value Date   WBC 5.6 11/20/2018   HGB 13.6 11/20/2018   HCT 41.9 11/20/2018   PLT 242.0 11/20/2018   GLUCOSE 97 11/20/2018   CHOL 188 11/20/2018   TRIG 88.0 11/20/2018   HDL 63.70 11/20/2018   LDLDIRECT 138.9 02/14/2012   LDLCALC 107 (H) 11/20/2018   ALT 18 11/20/2018   AST 17 11/20/2018   NA 139 11/20/2018   K 4.1 11/20/2018   CL 105 11/20/2018   CREATININE 0.79 11/20/2018   BUN 12 11/20/2018   CO2 27 11/20/2018   TSH 3.18 11/20/2018   HGBA1C 5.6 05/23/2006    No results found.  Assessment  & Plan:     Follow-up: No follow-ups on file.  Walker Kehr, MD

## 2019-11-21 NOTE — Assessment & Plan Note (Addendum)
  We discussed age appropriate health related issues, including available/recomended screening tests and vaccinations. Labs were ordered to be later reviewed . All questions were answered. We discussed one or more of the following - seat belt use, use of sunscreen/sun exposure exercise, safe sex, fall risk reduction, second hand smoke exposure, firearm use and storage, seat belt use, a need for adhering to healthy diet and exercise. Labs were ordered.  All questions were answered.   Colon due in 2026 Dr Henrene Pastor Vaccines are up up to date Flu, Shingrix, COVID

## 2019-11-28 ENCOUNTER — Ambulatory Visit (INDEPENDENT_AMBULATORY_CARE_PROVIDER_SITE_OTHER): Payer: 59 | Admitting: Nurse Practitioner

## 2019-11-28 ENCOUNTER — Other Ambulatory Visit: Payer: Self-pay

## 2019-11-28 ENCOUNTER — Encounter: Payer: Self-pay | Admitting: Nurse Practitioner

## 2019-11-28 VITALS — BP 122/78 | Ht 67.0 in | Wt 156.0 lb

## 2019-11-28 DIAGNOSIS — Z01419 Encounter for gynecological examination (general) (routine) without abnormal findings: Secondary | ICD-10-CM | POA: Diagnosis not present

## 2019-11-28 NOTE — Progress Notes (Signed)
   Jamie Roberts [birth year] [id number]   History:  65 y.o. G1P1 presents for annual exam without GYN complaints. Postmenopausal - no HRT, no bleeding. Normal pap and mammogram history. Not currently sexually active. Hypothyroidism, anxiety/depression managed by PCP.   Gynecologic History No LMP recorded. Patient is postmenopausal.   Contraception: post menopausal status Last Pap: 08/27/2015. Results were: normal Last mammogram: 11/21/2019. Results were: normal Last colonoscopy: 09/18/2014. Results were: normal Last Dexa: Never  Past medical history, past surgical history, family history and social history were all reviewed and documented in the EPIC chart.  ROS:  A ROS was performed and pertinent positives and negatives are included.  Exam:  Vitals:   11/28/19 0803  BP: 122/78  Weight: 156 lb (70.8 kg)  Height: 5\' 7"  (1.702 m)   Body mass index is 24.43 kg/m.  General appearance:  Normal Thyroid:  Symmetrical, normal in size, without palpable masses or nodularity. Respiratory  Auscultation:  Clear without wheezing or rhonchi Cardiovascular  Auscultation:  Regular rate, without rubs, murmurs or gallops  Edema/varicosities:  Not grossly evident Abdominal  Soft,nontender, without masses, guarding or rebound.  Liver/spleen:  No organomegaly noted  Hernia:  None appreciated  Skin  Inspection:  Grossly normal   Breasts: Examined lying and sitting.   Right: Without masses, retractions, discharge or axillary adenopathy.   Left: Without masses, retractions, discharge or axillary adenopathy. Gentitourinary   Inguinal/mons:  Normal without inguinal adenopathy  External genitalia:  Normal  BUS/Urethra/Skene's glands:  Normal  Vagina:  Normal  Cervix:  Normal  Uterus:  Normal in size, shape and contour.  Midline and mobile  Adnexa/parametria:     Rt: Without masses or tenderness.   Lt: Without masses or tenderness.  Anus and perineum: Normal  Digital rectal exam: Normal  sphincter tone without palpated masses or tenderness  Assessment/Plan:  64 y.o. G1P1 for annual exam.   Well female exam with routine gynecological exam - Education provided on SBEs, importance of preventative screenings, current guidelines, high calcium diet, regular exercise, and multivitamin daily. Labs done with PCP.   Screening for osteoporosis  - menopausal since age 30. Recommend bone density. She has the capability to do this at work so she will get this done.  Screening for cervical cancer - normal pap history. Will repeat at 5 year interval per guidelines.   Screening for breast cancer - Normal mammogram history. Normal breast exam today.   Screening for colon cancer - 2016 normal colonoscopy. Will repeat at their recommended interval.  Follow up in 1 year for annual.      Jamie Roberts [facility], 8:21 AM 11/28/2019

## 2019-11-28 NOTE — Patient Instructions (Addendum)
Schedule Bone Density!  Health Maintenance for Postmenopausal Women Menopause is a normal process in which your ability to get pregnant comes to an end. This process happens slowly over many months or years, usually between the ages of 31 and 80. Menopause is complete when you have missed your menstrual periods for 12 months. It is important to talk with your health care provider about some of the most common conditions that affect women after menopause (postmenopausal women). These include heart disease, cancer, and bone loss (osteoporosis). Adopting a healthy lifestyle and getting preventive care can help to promote your health and wellness. The actions you take can also lower your chances of developing some of these common conditions. What should I know about menopause? During menopause, you may get a number of symptoms, such as:  Hot flashes. These can be moderate or severe.  Night sweats.  Decrease in sex drive.  Mood swings.  Headaches.  Tiredness.  Irritability.  Memory problems.  Insomnia. Choosing to treat or not to treat these symptoms is a decision that you make with your health care provider. Do I need hormone replacement therapy?  Hormone replacement therapy is effective in treating symptoms that are caused by menopause, such as hot flashes and night sweats.  Hormone replacement carries certain risks, especially as you become older. If you are thinking about using estrogen or estrogen with progestin, discuss the benefits and risks with your health care provider. What is my risk for heart disease and stroke? The risk of heart disease, heart attack, and stroke increases as you age. One of the causes may be a change in the body's hormones during menopause. This can affect how your body uses dietary fats, triglycerides, and cholesterol. Heart attack and stroke are medical emergencies. There are many things that you can do to help prevent heart disease and stroke. Watch your  blood pressure  High blood pressure causes heart disease and increases the risk of stroke. This is more likely to develop in people who have high blood pressure readings, are of African descent, or are overweight.  Have your blood pressure checked: ? Every 3-5 years if you are 8-35 years of age. ? Every year if you are 57 years old or older. Eat a healthy diet   Eat a diet that includes plenty of vegetables, fruits, low-fat dairy products, and lean protein.  Do not eat a lot of foods that are high in solid fats, added sugars, or sodium. Get regular exercise Get regular exercise. This is one of the most important things you can do for your health. Most adults should:  Try to exercise for at least 150 minutes each week. The exercise should increase your heart rate and make you sweat (moderate-intensity exercise).  Try to do strengthening exercises at least twice each week. Do these in addition to the moderate-intensity exercise.  Spend less time sitting. Even light physical activity can be beneficial. Other tips  Work with your health care provider to achieve or maintain a healthy weight.  Do not use any products that contain nicotine or tobacco, such as cigarettes, e-cigarettes, and chewing tobacco. If you need help quitting, ask your health care provider.  Know your numbers. Ask your health care provider to check your cholesterol and your blood sugar (glucose). Continue to have your blood tested as directed by your health care provider. Do I need screening for cancer? Depending on your health history and family history, you may need to have cancer screening at different stages  of your life. This may include screening for:  Breast cancer.  Cervical cancer.  Lung cancer.  Colorectal cancer. What is my risk for osteoporosis? After menopause, you may be at increased risk for osteoporosis. Osteoporosis is a condition in which bone destruction happens more quickly than new bone  creation. To help prevent osteoporosis or the bone fractures that can happen because of osteoporosis, you may take the following actions:  If you are 19-57 years old, get at least 1,000 mg of calcium and at least 600 mg of vitamin D per day.  If you are older than age 1 but younger than age 25, get at least 1,200 mg of calcium and at least 600 mg of vitamin D per day.  If you are older than age 32, get at least 1,200 mg of calcium and at least 800 mg of vitamin D per day. Smoking and drinking excessive alcohol increase the risk of osteoporosis. Eat foods that are rich in calcium and vitamin D, and do weight-bearing exercises several times each week as directed by your health care provider. How does menopause affect my mental health? Depression may occur at any age, but it is more common as you become older. Common symptoms of depression include:  Low or sad mood.  Changes in sleep patterns.  Changes in appetite or eating patterns.  Feeling an overall lack of motivation or enjoyment of activities that you previously enjoyed.  Frequent crying spells. Talk with your health care provider if you think that you are experiencing depression. General instructions See your health care provider for regular wellness exams and vaccines. This may include:  Scheduling regular health, dental, and eye exams.  Getting and maintaining your vaccines. These include: ? Influenza vaccine. Get this vaccine each year before the flu season begins. ? Pneumonia vaccine. ? Shingles vaccine. ? Tetanus, diphtheria, and pertussis (Tdap) booster vaccine. Your health care provider may also recommend other immunizations. Tell your health care provider if you have ever been abused or do not feel safe at home. Summary  Menopause is a normal process in which your ability to get pregnant comes to an end.  This condition causes hot flashes, night sweats, decreased interest in sex, mood swings, headaches, or lack of  sleep.  Treatment for this condition may include hormone replacement therapy.  Take actions to keep yourself healthy, including exercising regularly, eating a healthy diet, watching your weight, and checking your blood pressure and blood sugar levels.  Get screened for cancer and depression. Make sure that you are up to date with all your vaccines. This information is not intended to replace advice given to you by your health care provider. Make sure you discuss any questions you have with your health care provider. Document Revised: 01/24/2018 Document Reviewed: 01/24/2018 Elsevier Patient Education  2020 [employer].

## 2020-08-18 ENCOUNTER — Other Ambulatory Visit: Payer: Self-pay | Admitting: Internal Medicine

## 2020-12-02 ENCOUNTER — Other Ambulatory Visit: Payer: Self-pay

## 2020-12-02 ENCOUNTER — Encounter: Payer: Self-pay | Admitting: Internal Medicine

## 2020-12-02 ENCOUNTER — Ambulatory Visit (INDEPENDENT_AMBULATORY_CARE_PROVIDER_SITE_OTHER): Payer: Managed Care, Other (non HMO) | Admitting: Internal Medicine

## 2020-12-02 DIAGNOSIS — Z0000 Encounter for general adult medical examination without abnormal findings: Secondary | ICD-10-CM | POA: Diagnosis not present

## 2020-12-02 DIAGNOSIS — Z23 Encounter for immunization: Secondary | ICD-10-CM | POA: Diagnosis not present

## 2020-12-02 LAB — COMPREHENSIVE METABOLIC PANEL
ALT: 14 U/L (ref 0–35)
AST: 18 U/L (ref 0–37)
Albumin: 4.4 g/dL (ref 3.5–5.2)
Alkaline Phosphatase: 61 U/L (ref 39–117)
BUN: 8 mg/dL (ref 6–23)
CO2: 29 mEq/L (ref 19–32)
Calcium: 9.5 mg/dL (ref 8.4–10.5)
Chloride: 105 mEq/L (ref 96–112)
Creatinine, Ser: 0.88 mg/dL (ref 0.40–1.20)
GFR: 69.72 mL/min (ref >60.00)
Glucose, Bld: 91 mg/dL (ref 70–99)
Potassium: 4.4 mEq/L (ref 3.5–5.1)
Sodium: 140 mEq/L (ref 135–145)
Total Bilirubin: 1 mg/dL (ref 0.2–1.2)
Total Protein: 7.5 g/dL (ref 6.0–8.3)

## 2020-12-02 LAB — CBC WITH DIFFERENTIAL/PLATELET
Basophils Absolute: 0.1 10*3/uL (ref 0.0–0.1)
Basophils Relative: 1.1 % (ref 0.0–3.0)
Eosinophils Absolute: 0.2 10*3/uL (ref 0.0–0.7)
Eosinophils Relative: 2.7 % (ref 0.0–5.0)
HCT: 41.9 % (ref 36.0–46.0)
Hemoglobin: 13.9 g/dL (ref 12.0–15.0)
Lymphocytes Relative: 29.5 % (ref 12.0–46.0)
Lymphs Abs: 1.7 10*3/uL (ref 0.7–4.0)
MCHC: 33.1 g/dL (ref 30.0–36.0)
MCV: 80.8 fl (ref 78.0–100.0)
Monocytes Absolute: 0.5 10*3/uL (ref 0.1–1.0)
Monocytes Relative: 8.6 % (ref 3.0–12.0)
Neutro Abs: 3.4 10*3/uL (ref 1.4–7.7)
Neutrophils Relative %: 58.1 % (ref 43.0–77.0)
Platelets: 241 10*3/uL (ref 150.0–400.0)
RBC: 5.19 Mil/uL — ABNORMAL HIGH (ref 3.87–5.11)
RDW: 14.2 % (ref 11.5–15.5)
WBC: 5.9 10*3/uL (ref 4.0–10.5)

## 2020-12-02 LAB — LIPID PANEL
Cholesterol: 215 mg/dL — ABNORMAL HIGH (ref 0–200)
HDL: 70.3 mg/dL (ref >39.00)
LDL Cholesterol: 130 mg/dL — ABNORMAL HIGH (ref 0–99)
NonHDL: 144.48
Total CHOL/HDL Ratio: 3
Triglycerides: 73 mg/dL (ref 0.0–149.0)
VLDL: 14.6 mg/dL (ref 0.0–40.0)

## 2020-12-02 LAB — URINALYSIS
Bilirubin Urine: NEGATIVE
Hgb urine dipstick: NEGATIVE
Ketones, ur: NEGATIVE
Leukocytes,Ua: NEGATIVE
Nitrite: NEGATIVE
Specific Gravity, Urine: 1.01 (ref 1.000–1.030)
Total Protein, Urine: NEGATIVE
Urine Glucose: NEGATIVE
Urobilinogen, UA: 0.2 (ref 0.0–1.0)
pH: 7.5 (ref 5.0–8.0)

## 2020-12-02 LAB — TSH: TSH: 5.8 u[IU]/mL — ABNORMAL HIGH (ref 0.35–5.50)

## 2020-12-02 MED ORDER — FLUOXETINE HCL 20 MG PO CAPS
20.0000 mg | ORAL_CAPSULE | Freq: Every day | ORAL | 3 refills | Status: DC
Start: 2020-12-02 — End: 2021-08-16

## 2020-12-02 MED ORDER — LEVOTHYROXINE SODIUM 50 MCG PO TABS
50.0000 ug | ORAL_TABLET | Freq: Every day | ORAL | 3 refills | Status: DC
Start: 2020-12-02 — End: 2021-08-16

## 2020-12-02 MED ORDER — SUMATRIPTAN SUCCINATE 100 MG PO TABS
ORAL_TABLET | ORAL | 5 refills | Status: DC
Start: 2020-12-02 — End: 2021-08-16

## 2020-12-02 NOTE — Assessment & Plan Note (Signed)

## 2020-12-02 NOTE — Progress Notes (Signed)
Subjective:  Patient ID: Jamie Roberts, female    DOB: 1955-11-28  Age: 65 y.o. MRN: [id number]  CC: Annual Exam   HPI Pami Briney presents for annual physical. F/u on HAs, depression   Immunization History  Administered Date(s) Administered   Hepatitis B 10/04/2004   Hepatitis B, adult 08/31/2004, 04/05/2005   Influenza Whole 11/15/2010   Influenza, Seasonal, Injecte, Preservative Fre 11/21/2019   Influenza,inj,Quad PF,6+ Mos 11/20/2018, 11/21/2019, 12/02/2020   Influenza-Unspecified 11/14/2012, 11/15/2014, 11/28/2017   PFIZER(Purple Top)SARS-COV-2 Vaccination 02/26/2019, 03/20/2019, 10/15/2019   Tdap 08/11/2015   Zoster Recombinat (Shingrix) 11/20/2018     Outpatient Medications Prior to Visit  Medication Sig Dispense Refill   Cholecalciferol (VITAMIN D3) 50 MCG (2000 UT) capsule Take 1 capsule (2,000 Units total) by mouth daily. 100 capsule 3   FLUoxetine (PROZAC) 20 MG capsule Take 1 capsule (20 mg total) by mouth daily. Annual appt due in Oct must see provider for future refills 90 capsule 0   levothyroxine (SYNTHROID) 50 MCG tablet Take 1 tablet (50 mcg total) by mouth daily. 90 tablet 3   SUMAtriptan (IMITREX) 100 MG tablet TAKE 1 TABLET AND in 2 HOURS once AS NEEDED FOR MIGRAINE 12 tablet 5   No facility-administered medications prior to visit.    ROS: Review of Systems  Constitutional:  Negative for activity change, appetite change, chills, fatigue and unexpected weight change.  HENT:  Negative for congestion, mouth sores and sinus pressure.   Eyes:  Negative for visual disturbance.  Respiratory:  Negative for cough and chest tightness.   Gastrointestinal:  Negative for abdominal pain and nausea.  Genitourinary:  Negative for difficulty urinating, frequency and vaginal pain.  Musculoskeletal:  Positive for arthralgias. Negative for back pain and gait problem.  Skin:  Negative for pallor and rash.  Neurological:  Positive for headaches. Negative for dizziness,  tremors, weakness and numbness.  Psychiatric/Behavioral:  Negative for confusion, sleep disturbance and suicidal ideas. The patient is not nervous/anxious.    Objective:  BP 112/82 (BP Location: Left Arm)   Pulse (!) 58   Temp 98.3 F (36.8 C) (Oral)   Ht 5\' 7"  (1.702 m)   Wt 162 lb 3.2 oz (73.6 kg)   SpO2 96%   BMI 25.40 kg/m   BP Readings from Last 3 Encounters:  12/02/20 112/82  11/28/19 122/78  11/21/19 (!) 142/86    Wt Readings from Last 3 Encounters:  12/02/20 162 lb 3.2 oz (73.6 kg)  11/28/19 156 lb (70.8 kg)  11/21/19 157 lb (71.2 kg)    Physical Exam Constitutional:      General: She is not in acute distress.    Appearance: She is well-developed.  HENT:     Head: Normocephalic.     Right Ear: External ear normal.     Left Ear: External ear normal.     Nose: Nose normal.  Eyes:     General:        Right eye: No discharge.        Left eye: No discharge.     Conjunctiva/sclera: Conjunctivae normal.     Pupils: Pupils are equal, round, and reactive to light.  Neck:     Thyroid: No thyromegaly.     Vascular: No JVD.     Trachea: No tracheal deviation.  Cardiovascular:     Rate and Rhythm: Normal rate and regular rhythm.     Heart sounds: Normal heart sounds.  Pulmonary:     Effort: No respiratory distress.  Breath sounds: No stridor. No wheezing.  Abdominal:     General: Bowel sounds are normal. There is no distension.     Palpations: Abdomen is soft. There is no mass.     Tenderness: There is no abdominal tenderness. There is no guarding or rebound.  Musculoskeletal:        General: No tenderness.     Cervical back: Normal range of motion and neck supple. No rigidity.  Lymphadenopathy:     Cervical: No cervical adenopathy.  Skin:    Findings: No erythema or rash.  Neurological:     Mental Status: She is oriented to person, place, and time.     Cranial Nerves: No cranial nerve deficit.     Motor: No abnormal muscle tone.     Coordination:  Coordination normal.     Deep Tendon Reflexes: Reflexes normal.  Psychiatric:        Behavior: Behavior normal.        Thought Content: Thought content normal.        Judgment: Judgment normal.    Lab Results  Component Value Date   WBC 6.1 11/21/2019   HGB 13.7 11/21/2019   HCT 41.9 11/21/2019   PLT 251.0 11/21/2019   GLUCOSE 92 11/21/2019   CHOL 194 11/21/2019   TRIG 93.0 11/21/2019   HDL 56.40 11/21/2019   LDLDIRECT 138.9 02/14/2012   LDLCALC 119 (H) 11/21/2019   ALT 14 11/21/2019   AST 16 11/21/2019   NA 140 11/21/2019   K 4.0 11/21/2019   CL 103 11/21/2019   CREATININE 0.76 11/21/2019   BUN 10 11/21/2019   CO2 32 11/21/2019   TSH 4.09 11/21/2019   HGBA1C 5.6 05/23/2006    MM 3D SCREEN BREAST BILATERAL  Result Date: 11/21/2019 CLINICAL DATA:  Screening. EXAM: DIGITAL SCREENING BILATERAL MAMMOGRAM WITH TOMO AND CAD COMPARISON:  Previous exam(s). ACR Breast Density Category c: The breast tissue is heterogeneously dense, which may obscure small masses. FINDINGS: There are no findings suspicious for malignancy. Images were processed with CAD. IMPRESSION: No mammographic evidence of malignancy. A result letter of this screening mammogram will be mailed directly to the patient. RECOMMENDATION: Screening mammogram in one year. (Code:SM-B-01Y) BI-RADS CATEGORY  1: Negative. Electronically Signed   By: Claudie Revering M.D.   On: 11/21/2019 12:44    Assessment & Plan:   Health Maintenance  Topic Date Due   HIV Screening  Never done   PAP SMEAR-Modifier  08/27/2018   Zoster Vaccines- Shingrix (2 of 2) 01/15/2019   COVID-19 Vaccine (4 - Booster for Pfizer series) 02/14/2020   MAMMOGRAM  11/20/2021   COLONOSCOPY (Pts 45-102yrs Insurance coverage will need to be confirmed)  09/17/2024   TETANUS/TDAP  08/10/2025   INFLUENZA VACCINE  Completed   Hepatitis C Screening  Completed   HPV VACCINES  Aged Out     Problem List Items Addressed This Visit     Well adult exam     We  discussed age appropriate health related issues, including available/recomended screening tests and vaccinations. Labs were ordered to be later reviewed . All questions were answered. We discussed one or more of the following - seat belt use, use of sunscreen/sun exposure exercise, fall risk reduction, second hand smoke exposure, firearm use and storage, seat belt use, a need for adhering to healthy diet and exercise. Labs were ordered.  All questions were answered.        Relevant Orders   TSH   Urinalysis   CBC  with Differential/Platelet   Lipid panel   Comprehensive metabolic panel      Meds ordered this encounter  Medications   levothyroxine (SYNTHROID) 50 MCG tablet    Sig: Take 1 tablet (50 mcg total) by mouth daily.    Dispense:  90 tablet    Refill:  3   SUMAtriptan (IMITREX) 100 MG tablet    Sig: TAKE 1 TABLET AND in 2 HOURS once AS NEEDED FOR MIGRAINE    Dispense:  12 tablet    Refill:  5   FLUoxetine (PROZAC) 20 MG capsule    Sig: Take 1 capsule (20 mg total) by mouth daily. Annual appt due in Oct must see provider for future refills    Dispense:  90 capsule    Refill:  3      Follow-up: Return in about 6 months (around 06/02/2021) for a follow-up visit.  Walker Kehr, MD

## 2021-08-16 ENCOUNTER — Encounter: Payer: Self-pay | Admitting: Internal Medicine

## 2021-08-16 ENCOUNTER — Ambulatory Visit: Payer: Managed Care, Other (non HMO) | Admitting: Internal Medicine

## 2021-08-16 DIAGNOSIS — L0292 Furuncle, unspecified: Secondary | ICD-10-CM | POA: Diagnosis not present

## 2021-08-16 DIAGNOSIS — F4321 Adjustment disorder with depressed mood: Secondary | ICD-10-CM

## 2021-08-16 DIAGNOSIS — E039 Hypothyroidism, unspecified: Secondary | ICD-10-CM

## 2021-08-16 DIAGNOSIS — R634 Abnormal weight loss: Secondary | ICD-10-CM

## 2021-08-16 DIAGNOSIS — F330 Major depressive disorder, recurrent, mild: Secondary | ICD-10-CM | POA: Diagnosis not present

## 2021-08-16 DIAGNOSIS — G43809 Other migraine, not intractable, without status migrainosus: Secondary | ICD-10-CM

## 2021-08-16 LAB — COMPREHENSIVE METABOLIC PANEL
ALT: 10 U/L (ref 0–35)
AST: 13 U/L (ref 0–37)
Albumin: 4.2 g/dL (ref 3.5–5.2)
Alkaline Phosphatase: 62 U/L (ref 39–117)
BUN: 8 mg/dL (ref 6–23)
CO2: 29 mEq/L (ref 19–32)
Calcium: 9.1 mg/dL (ref 8.4–10.5)
Chloride: 103 mEq/L (ref 96–112)
Creatinine, Ser: 0.74 mg/dL (ref 0.40–1.20)
GFR: 85.42 mL/min (ref >60.00)
Glucose, Bld: 93 mg/dL (ref 70–99)
Potassium: 4 mEq/L (ref 3.5–5.1)
Sodium: 139 mEq/L (ref 135–145)
Total Bilirubin: 0.7 mg/dL (ref 0.2–1.2)
Total Protein: 7.1 g/dL (ref 6.0–8.3)

## 2021-08-16 LAB — CBC WITH DIFFERENTIAL/PLATELET
Basophils Absolute: 0 10*3/uL (ref 0.0–0.1)
Basophils Relative: 0.8 % (ref 0.0–3.0)
Eosinophils Absolute: 0.1 10*3/uL (ref 0.0–0.7)
Eosinophils Relative: 1.9 % (ref 0.0–5.0)
HCT: 41.2 % (ref 36.0–46.0)
Hemoglobin: 13.8 g/dL (ref 12.0–15.0)
Lymphocytes Relative: 24 % (ref 12.0–46.0)
Lymphs Abs: 1.4 10*3/uL (ref 0.7–4.0)
MCHC: 33.5 g/dL (ref 30.0–36.0)
MCV: 83.4 fl (ref 78.0–100.0)
Monocytes Absolute: 0.4 10*3/uL (ref 0.1–1.0)
Monocytes Relative: 7.6 % (ref 3.0–12.0)
Neutro Abs: 3.7 10*3/uL (ref 1.4–7.7)
Neutrophils Relative %: 65.7 % (ref 43.0–77.0)
Platelets: 244 10*3/uL (ref 150.0–400.0)
RBC: 4.94 Mil/uL (ref 3.87–5.11)
RDW: 13.9 % (ref 11.5–15.5)
WBC: 5.6 10*3/uL (ref 4.0–10.5)

## 2021-08-16 LAB — TSH: TSH: 4.57 u[IU]/mL (ref 0.35–5.50)

## 2021-08-16 LAB — T4, FREE: Free T4: 0.96 ng/dL (ref 0.60–1.60)

## 2021-08-16 MED ORDER — FLUOXETINE HCL 20 MG PO CAPS: 20.0000 mg | ORAL_CAPSULE | Freq: Every day | ORAL | 3 refills | Status: DC

## 2021-08-16 MED ORDER — LEVOTHYROXINE SODIUM 50 MCG PO TABS: 50.0000 ug | ORAL_TABLET | Freq: Every day | ORAL | 3 refills | Status: DC

## 2021-08-16 MED ORDER — SUMATRIPTAN SUCCINATE 100 MG PO TABS: ORAL_TABLET | ORAL | 5 refills | Status: DC

## 2021-08-16 MED ORDER — DOXYCYCLINE HYCLATE 100 MG PO TABS: 100.0000 mg | ORAL_TABLET | Freq: Two times a day (BID) | ORAL | 0 refills | Status: DC

## 2021-08-16 NOTE — Assessment & Plan Note (Signed)
Cont on Levothroid TSH, FT4

## 2021-08-16 NOTE — Assessment & Plan Note (Signed)
Grieving - her niece died 3 wks ago at 59 08-17-22) Cont on Fluoxetine

## 2021-08-16 NOTE — Progress Notes (Signed)
Subjective:  Patient ID: Jamie Roberts, female    DOB: 11-Jul-1955  Age: 66 y.o. MRN: [id number]  CC: No chief complaint on file.   HPI Araceli Malter presents for grief C/o a swelling in the L axilla Pt lost wt on diet  Outpatient Medications Prior to Visit  Medication Sig Dispense Refill   Cholecalciferol (VITAMIN D3) 50 MCG (2000 UT) capsule Take 1 capsule (2,000 Units total) by mouth daily. 100 capsule 3   FLUoxetine (PROZAC) 20 MG capsule Take 1 capsule (20 mg total) by mouth daily. Annual appt due in Oct must see provider for future refills 90 capsule 3   levothyroxine (SYNTHROID) 50 MCG tablet Take 1 tablet (50 mcg total) by mouth daily. 90 tablet 3   SUMAtriptan (IMITREX) 100 MG tablet TAKE 1 TABLET AND in 2 HOURS once AS NEEDED FOR MIGRAINE 12 tablet 5   No facility-administered medications prior to visit.    ROS: Review of Systems  Constitutional:  Negative for activity change, appetite change, chills, fatigue and unexpected weight change.  HENT:  Negative for congestion, mouth sores and sinus pressure.   Eyes:  Negative for visual disturbance.  Respiratory:  Negative for cough and chest tightness.   Gastrointestinal:  Negative for abdominal pain and nausea.  Genitourinary:  Negative for difficulty urinating, frequency and vaginal pain.  Musculoskeletal:  Negative for back pain and gait problem.  Skin:  Positive for color change. Negative for pallor and rash.  Neurological:  Negative for dizziness, tremors, weakness, numbness and headaches.  Psychiatric/Behavioral:  Positive for sleep disturbance. Negative for confusion and suicidal ideas. The patient is nervous/anxious.   sad  Objective:  BP 130/90 (BP Location: Left Arm, Patient Position: Sitting, Cuff Size: Large)   Pulse 72   Temp 97.7 F (36.5 C) (Oral)   Ht '5\' 7"'$  (1.702 m)   Wt 130 lb (59 kg)   SpO2 93%   BMI 20.36 kg/m   BP Readings from Last 3 Encounters:  08/16/21 130/90  12/02/20 112/82  11/28/19  122/78    Wt Readings from Last 3 Encounters:  08/16/21 130 lb (59 kg)  12/02/20 162 lb 3.2 oz (73.6 kg)  11/28/19 156 lb (70.8 kg)    Physical Exam Constitutional:      General: She is not in acute distress.    Appearance: She is well-developed.  HENT:     Head: Normocephalic.     Right Ear: External ear normal.     Left Ear: External ear normal.     Nose: Nose normal.  Eyes:     General:        Right eye: No discharge.        Left eye: No discharge.     Conjunctiva/sclera: Conjunctivae normal.     Pupils: Pupils are equal, round, and reactive to light.  Neck:     Thyroid: No thyromegaly.     Vascular: No JVD.     Trachea: No tracheal deviation.  Cardiovascular:     Rate and Rhythm: Normal rate and regular rhythm.     Heart sounds: Normal heart sounds.  Pulmonary:     Effort: No respiratory distress.     Breath sounds: No stridor. No wheezing.  Abdominal:     General: Bowel sounds are normal. There is no distension.     Palpations: Abdomen is soft. There is no mass.     Tenderness: There is no abdominal tenderness. There is no guarding or rebound.  Musculoskeletal:  General: No tenderness.     Cervical back: Normal range of motion and neck supple. No rigidity.  Lymphadenopathy:     Cervical: No cervical adenopathy.  Skin:    Findings: No erythema or rash.  Neurological:     Cranial Nerves: No cranial nerve deficit.     Motor: No abnormal muscle tone.     Coordination: Coordination normal.     Deep Tendon Reflexes: Reflexes normal.  Psychiatric:        Behavior: Behavior normal.        Thought Content: Thought content normal.        Judgment: Judgment normal.     Lab Results  Component Value Date   WBC 5.9 12/02/2020   HGB 13.9 12/02/2020   HCT 41.9 12/02/2020   PLT 241.0 12/02/2020   GLUCOSE 91 12/02/2020   CHOL 215 (H) 12/02/2020   TRIG 73.0 12/02/2020   HDL 70.30 12/02/2020   LDLDIRECT 138.9 02/14/2012   LDLCALC 130 (H) 12/02/2020   ALT 14  12/02/2020   AST 18 12/02/2020   NA 140 12/02/2020   K 4.4 12/02/2020   CL 105 12/02/2020   CREATININE 0.88 12/02/2020   BUN 8 12/02/2020   CO2 29 12/02/2020   TSH 5.80 (H) 12/02/2020   HGBA1C 5.6 05/23/2006    MM 3D SCREEN BREAST BILATERAL  Result Date: 11/21/2019 CLINICAL DATA:  Screening. EXAM: DIGITAL SCREENING BILATERAL MAMMOGRAM WITH TOMO AND CAD COMPARISON:  Previous exam(s). ACR Breast Density Category c: The breast tissue is heterogeneously dense, which may obscure small masses. FINDINGS: There are no findings suspicious for malignancy. Images were processed with CAD. IMPRESSION: No mammographic evidence of malignancy. A result letter of this screening mammogram will be mailed directly to the patient. RECOMMENDATION: Screening mammogram in one year. (Code:SM-B-01Y) BI-RADS CATEGORY  1: Negative. Electronically Signed   By: Claudie Revering M.D.   On: 11/21/2019 12:44    Assessment & Plan:   Problem List Items Addressed This Visit     Boil   Relevant Orders   CBC with Differential/Platelet   Comprehensive metabolic panel   Depression    Grieving - her niece died 3 wks ago at 38 08/25/2022) Cont on Fluoxetine      Relevant Medications   FLUoxetine (PROZAC) 20 MG capsule   Grief    Grieving - her niece died 3 wks ago at 79 2022-08-25) Cont on Fluoxetine      Hypothyroidism    Cont on Levothroid TSH, FT4       Relevant Medications   levothyroxine (SYNTHROID) 50 MCG tablet   Other Relevant Orders   T4, free   TSH   Comprehensive metabolic panel   Migraine variant    On sumatriptane      Relevant Medications   FLUoxetine (PROZAC) 20 MG capsule   SUMAtriptan (IMITREX) 100 MG tablet   Weight loss    lost wt on diet         Meds ordered this encounter  Medications   FLUoxetine (PROZAC) 20 MG capsule    Sig: Take 1 capsule (20 mg total) by mouth daily. Annual appt due in Oct must see provider for future refills    Dispense:  90 capsule    Refill:  3    levothyroxine (SYNTHROID) 50 MCG tablet    Sig: Take 1 tablet (50 mcg total) by mouth daily.    Dispense:  90 tablet    Refill:  3   SUMAtriptan (IMITREX) 100 MG tablet  Sig: TAKE 1 TABLET AND in 2 HOURS once AS NEEDED FOR MIGRAINE    Dispense:  12 tablet    Refill:  5   doxycycline (VIBRA-TABS) 100 MG tablet    Sig: Take 1 tablet (100 mg total) by mouth 2 (two) times daily.    Dispense:  20 tablet    Refill:  0      Follow-up: Return in about 6 months (around 02/16/2022) for Wellness Exam.  Walker Kehr, MD

## 2021-08-16 NOTE — Assessment & Plan Note (Signed)
Grieving - her niece died 3 wks ago at 11 Aug 30, 2022) Cont on Fluoxetine

## 2021-08-16 NOTE — Assessment & Plan Note (Signed)
lost wt on diet

## 2021-08-16 NOTE — Assessment & Plan Note (Signed)
On sumatriptane

## 2022-05-06 ENCOUNTER — Other Ambulatory Visit: Payer: Self-pay | Admitting: Internal Medicine

## 2022-05-18 ENCOUNTER — Other Ambulatory Visit: Payer: Managed Care, Other (non HMO)

## 2022-05-18 ENCOUNTER — Ambulatory Visit: Payer: Managed Care, Other (non HMO) | Admitting: Internal Medicine

## 2022-05-18 VITALS — BP 110/60 | HR 69 | Temp 98.6°F | Ht 67.0 in | Wt 134.0 lb

## 2022-05-18 DIAGNOSIS — F330 Major depressive disorder, recurrent, mild: Secondary | ICD-10-CM | POA: Diagnosis not present

## 2022-05-18 DIAGNOSIS — G43809 Other migraine, not intractable, without status migrainosus: Secondary | ICD-10-CM

## 2022-05-18 DIAGNOSIS — E039 Hypothyroidism, unspecified: Secondary | ICD-10-CM

## 2022-05-18 DIAGNOSIS — Z0000 Encounter for general adult medical examination without abnormal findings: Secondary | ICD-10-CM | POA: Diagnosis not present

## 2022-05-18 LAB — CBC WITH DIFFERENTIAL/PLATELET
Basophils Absolute: 0.1 10*3/uL (ref 0.0–0.1)
Basophils Relative: 1.3 % (ref 0.0–3.0)
Eosinophils Absolute: 0.1 10*3/uL (ref 0.0–0.7)
Eosinophils Relative: 2.2 % (ref 0.0–5.0)
HCT: 41.7 % (ref 36.0–46.0)
Hemoglobin: 14.2 g/dL (ref 12.0–15.0)
Lymphocytes Relative: 34.4 % (ref 12.0–46.0)
Lymphs Abs: 2.1 10*3/uL (ref 0.7–4.0)
MCHC: 34 g/dL (ref 30.0–36.0)
MCV: 83.7 fl (ref 78.0–100.0)
Monocytes Absolute: 0.5 10*3/uL (ref 0.1–1.0)
Monocytes Relative: 8.7 % (ref 3.0–12.0)
Neutro Abs: 3.2 10*3/uL (ref 1.4–7.7)
Neutrophils Relative %: 53.4 % (ref 43.0–77.0)
Platelets: 240 10*3/uL (ref 150.0–400.0)
RBC: 4.98 Mil/uL (ref 3.87–5.11)
RDW: 14 % (ref 11.5–15.5)
WBC: 6 10*3/uL (ref 4.0–10.5)

## 2022-05-18 MED ORDER — LEVOTHYROXINE SODIUM 50 MCG PO TABS: 50.0000 ug | ORAL_TABLET | Freq: Every day | ORAL | 3 refills | Status: DC

## 2022-05-18 MED ORDER — FLUOXETINE HCL 20 MG PO CAPS: 20.0000 mg | ORAL_CAPSULE | Freq: Every day | ORAL | 3 refills | Status: DC

## 2022-05-18 MED ORDER — BUPROPION HCL ER (SR) 100 MG PO TB12: 100.0000 mg | ORAL_TABLET | Freq: Two times a day (BID) | ORAL | 5 refills | Status: DC

## 2022-05-18 MED ORDER — SUMATRIPTAN SUCCINATE 100 MG PO TABS: ORAL_TABLET | ORAL | 5 refills | Status: DC

## 2022-05-18 NOTE — Progress Notes (Signed)
Subjective:  Patient ID: Jamie BestLiliana Roberts, female    DOB: 10/07/1955  Age: 67 y.o. MRN: [id number]  CC: Medication Problem   HPI Jamie Roberts presents for hypothyroidim, depression, migraine headaches.  Depression is worse.  Outpatient Medications Prior to Visit  Medication Sig Dispense Refill   acetic acid-hydrocortisone (VOSOL-HC) OTIC solution SMARTSIG:2 Drop(s) In Ear(s) Twice Daily PRN     Cholecalciferol (VITAMIN D3) 50 MCG (2000 UT) capsule Take 1 capsule (2,000 Units total) by mouth daily. 100 capsule 3   meclizine (ANTIVERT) 25 MG tablet Take by mouth.     FLUoxetine (PROZAC) 20 MG capsule Take 1 capsule (20 mg total) by mouth daily. Annual appt due in Oct must see provider for future refills 90 capsule 3   levothyroxine (SYNTHROID) 50 MCG tablet Take 1 tablet (50 mcg total) by mouth daily. 90 tablet 3   SUMAtriptan (IMITREX) 100 MG tablet TAKE 1 TABLET AND in 2 HOURS once AS NEEDED FOR MIGRAINE 12 tablet 5   doxycycline (VIBRA-TABS) 100 MG tablet Take 1 tablet (100 mg total) by mouth 2 (two) times daily. 20 tablet 0   No facility-administered medications prior to visit.    ROS: Review of Systems  Constitutional:  Negative for activity change, appetite change, chills, fatigue and unexpected weight change.  HENT:  Negative for congestion, mouth sores and sinus pressure.   Eyes:  Negative for visual disturbance.  Respiratory:  Negative for cough and chest tightness.   Gastrointestinal:  Negative for abdominal pain and nausea.  Genitourinary:  Negative for difficulty urinating, frequency and vaginal pain.  Musculoskeletal:  Negative for back pain and gait problem.  Skin:  Negative for pallor and rash.  Neurological:  Negative for dizziness, tremors, weakness, numbness and headaches.  Psychiatric/Behavioral:  Positive for dysphoric mood. Negative for confusion, sleep disturbance and suicidal ideas.     Objective:  BP 110/60 (BP Location: Left Arm, Patient Position: Sitting,  Cuff Size: Large)   Pulse 69   Temp 98.6 F (37 C) (Oral)   Ht 5\' 7"  (1.702 m)   Wt 134 lb (60.8 kg)   SpO2 98%   BMI 20.99 kg/m   BP Readings from Last 3 Encounters:  05/18/22 110/60  08/16/21 130/90  12/02/20 112/82    Wt Readings from Last 3 Encounters:  05/18/22 134 lb (60.8 kg)  08/16/21 130 lb (59 kg)  12/02/20 162 lb 3.2 oz (73.6 kg)    Physical Exam Constitutional:      General: She is not in acute distress.    Appearance: Normal appearance. She is well-developed.  HENT:     Head: Normocephalic.     Right Ear: External ear normal.     Left Ear: External ear normal.     Nose: Nose normal.  Eyes:     General:        Right eye: No discharge.        Left eye: No discharge.     Conjunctiva/sclera: Conjunctivae normal.     Pupils: Pupils are equal, round, and reactive to light.  Neck:     Thyroid: No thyromegaly.     Vascular: No JVD.     Trachea: No tracheal deviation.  Cardiovascular:     Rate and Rhythm: Normal rate and regular rhythm.     Heart sounds: Normal heart sounds.  Pulmonary:     Effort: No respiratory distress.     Breath sounds: No stridor. No wheezing.  Abdominal:     General: Bowel sounds  are normal. There is no distension.     Palpations: Abdomen is soft. There is no mass.     Tenderness: There is no abdominal tenderness. There is no guarding or rebound.  Musculoskeletal:        General: No tenderness.     Cervical back: Normal range of motion and neck supple. No rigidity.  Lymphadenopathy:     Cervical: No cervical adenopathy.  Skin:    Findings: No erythema or rash.  Neurological:     Cranial Nerves: No cranial nerve deficit.     Motor: No abnormal muscle tone.     Coordination: Coordination normal.     Deep Tendon Reflexes: Reflexes normal.  Psychiatric:        Behavior: Behavior normal.        Thought Content: Thought content normal.        Judgment: Judgment normal.     Lab Results  Component Value Date   WBC 6.0  05/18/2022   HGB 14.2 05/18/2022   HCT 41.7 05/18/2022   PLT 240.0 05/18/2022   GLUCOSE 77 05/18/2022   CHOL 187 05/18/2022   TRIG 92.0 05/18/2022   HDL 61.30 05/18/2022   LDLDIRECT 138.9 02/14/2012   LDLCALC 107 (H) 05/18/2022   ALT 8 05/18/2022   AST 12 05/18/2022   NA 139 05/18/2022   K 3.9 05/18/2022   CL 105 05/18/2022   CREATININE 0.71 05/18/2022   BUN 11 05/18/2022   CO2 26 05/18/2022   TSH 4.93 05/18/2022   HGBA1C 5.6 05/23/2006    MM 3D SCREEN BREAST BILATERAL  Result Date: 11/21/2019 CLINICAL DATA:  Screening. EXAM: DIGITAL SCREENING BILATERAL MAMMOGRAM WITH TOMO AND CAD COMPARISON:  Previous exam(s). ACR Breast Density Category c: The breast tissue is heterogeneously dense, which may obscure small masses. FINDINGS: There are no findings suspicious for malignancy. Images were processed with CAD. IMPRESSION: No mammographic evidence of malignancy. A result letter of this screening mammogram will be mailed directly to the patient. RECOMMENDATION: Screening mammogram in one year. (Code:SM-B-01Y) BI-RADS CATEGORY  1: Negative. Electronically Signed   By: Beckie SaltsSteven  Reid M.D.   On: 11/21/2019 12:44    Assessment & Plan:   Problem List Items Addressed This Visit       Cardiovascular and Mediastinum   Migraine variant    Continue on Imitrex as needed      Relevant Medications   SUMAtriptan (IMITREX) 100 MG tablet   FLUoxetine (PROZAC) 20 MG capsule   buPROPion ER (WELLBUTRIN SR) 100 MG 12 hr tablet     Endocrine   Hypothyroidism - Primary    Continue on Levothroid Obtain thyroid test      Relevant Medications   levothyroxine (SYNTHROID) 50 MCG tablet   Other Relevant Orders   TSH (Completed)   Urinalysis (Completed)   CBC with Differential/Platelet (Completed)   Lipid panel (Completed)   Comprehensive metabolic panel (Completed)     Other   Depression    Worse.  Continue fluoxetine.  Start taking Wellbutrin SR 100 mg once a day in the morning       Relevant Medications   FLUoxetine (PROZAC) 20 MG capsule   buPROPion ER (WELLBUTRIN SR) 100 MG 12 hr tablet   Other Relevant Orders   TSH (Completed)   Urinalysis (Completed)   CBC with Differential/Platelet (Completed)   Lipid panel (Completed)   Comprehensive metabolic panel (Completed)   Well adult exam   Relevant Orders   T4, free (Completed)  Meds ordered this encounter  Medications   levothyroxine (SYNTHROID) 50 MCG tablet    Sig: Take 1 tablet (50 mcg total) by mouth daily.    Dispense:  90 tablet    Refill:  3   SUMAtriptan (IMITREX) 100 MG tablet    Sig: TAKE 1 TABLET AND in 2 HOURS once AS NEEDED FOR MIGRAINE    Dispense:  12 tablet    Refill:  5   FLUoxetine (PROZAC) 20 MG capsule    Sig: Take 1 capsule (20 mg total) by mouth daily. Annual appt due in Oct must see provider for future refills    Dispense:  90 capsule    Refill:  3   DISCONTD: buPROPion ER (WELLBUTRIN SR) 100 MG 12 hr tablet    Sig: Take 1 tablet (100 mg total) by mouth 2 (two) times daily.    Dispense:  30 tablet    Refill:  5   buPROPion ER (WELLBUTRIN SR) 100 MG 12 hr tablet    Sig: Take 1 tablet (100 mg total) by mouth every morning.    Dispense:  30 tablet    Refill:  5      Follow-up: Return in about 3 months (around 08/17/2022) for a follow-up visit.  Sonda Primes, MD

## 2022-05-19 ENCOUNTER — Encounter: Payer: Self-pay | Admitting: Internal Medicine

## 2022-05-19 DIAGNOSIS — E785 Hyperlipidemia, unspecified: Secondary | ICD-10-CM | POA: Insufficient documentation

## 2022-05-19 LAB — COMPREHENSIVE METABOLIC PANEL
ALT: 8 U/L (ref 0–35)
AST: 12 U/L (ref 0–37)
Albumin: 4.3 g/dL (ref 3.5–5.2)
Alkaline Phosphatase: 58 U/L (ref 39–117)
BUN: 11 mg/dL (ref 6–23)
CO2: 26 mEq/L (ref 19–32)
Calcium: 9.1 mg/dL (ref 8.4–10.5)
Chloride: 105 mEq/L (ref 96–112)
Creatinine, Ser: 0.71 mg/dL (ref 0.40–1.20)
GFR: 89.29 mL/min (ref >60.00)
Glucose, Bld: 77 mg/dL (ref 70–99)
Potassium: 3.9 mEq/L (ref 3.5–5.1)
Sodium: 139 mEq/L (ref 135–145)
Total Bilirubin: 0.7 mg/dL (ref 0.2–1.2)
Total Protein: 6.9 g/dL (ref 6.0–8.3)

## 2022-05-19 LAB — LIPID PANEL
Cholesterol: 187 mg/dL (ref 0–200)
HDL: 61.3 mg/dL
LDL Cholesterol: 107 mg/dL — ABNORMAL HIGH (ref 0–99)
NonHDL: 125.47
Total CHOL/HDL Ratio: 3
Triglycerides: 92 mg/dL (ref 0.0–149.0)
VLDL: 18.4 mg/dL (ref 0.0–40.0)

## 2022-05-19 LAB — URINALYSIS
Bilirubin Urine: NEGATIVE
Hgb urine dipstick: NEGATIVE
Ketones, ur: NEGATIVE
Leukocytes,Ua: NEGATIVE
Nitrite: NEGATIVE
Specific Gravity, Urine: 1.01 (ref 1.000–1.030)
Total Protein, Urine: NEGATIVE
Urine Glucose: NEGATIVE
Urobilinogen, UA: 0.2 (ref 0.0–1.0)
pH: 6.5 (ref 5.0–8.0)

## 2022-05-19 LAB — T4, FREE: Free T4: 1.1 ng/dL (ref 0.60–1.60)

## 2022-05-19 LAB — TSH: TSH: 4.93 u[IU]/mL (ref 0.35–5.50)

## 2022-05-23 ENCOUNTER — Encounter: Payer: Self-pay | Admitting: Internal Medicine

## 2022-05-23 MED ORDER — BUPROPION HCL ER (SR) 100 MG PO TB12: 100.0000 mg | ORAL_TABLET | Freq: Every morning | ORAL | 5 refills | Status: DC

## 2022-05-23 NOTE — Assessment & Plan Note (Signed)
Continue on Imitrex as needed

## 2022-05-23 NOTE — Assessment & Plan Note (Addendum)
Continue on Levothroid Obtain thyroid test

## 2022-05-23 NOTE — Assessment & Plan Note (Addendum)
Worse.  Continue fluoxetine.  Start taking Wellbutrin SR 100 mg once a day in the morning

## 2022-06-26 ENCOUNTER — Other Ambulatory Visit: Payer: Self-pay | Admitting: Internal Medicine

## 2022-11-03 ENCOUNTER — Encounter: Payer: Self-pay | Admitting: Internal Medicine

## 2022-11-09 ENCOUNTER — Other Ambulatory Visit: Payer: Self-pay | Admitting: Internal Medicine

## 2022-11-09 DIAGNOSIS — D485 Neoplasm of uncertain behavior of skin: Secondary | ICD-10-CM

## 2022-11-20 ENCOUNTER — Other Ambulatory Visit: Payer: Self-pay | Admitting: Internal Medicine

## 2023-05-21 ENCOUNTER — Other Ambulatory Visit: Payer: Self-pay | Admitting: Internal Medicine

## 2023-05-23 ENCOUNTER — Other Ambulatory Visit: Payer: Self-pay | Admitting: Internal Medicine

## 2023-07-23 ENCOUNTER — Other Ambulatory Visit: Payer: Self-pay | Admitting: Internal Medicine

## 2023-08-30 ENCOUNTER — Ambulatory Visit: Admitting: Internal Medicine

## 2023-08-30 VITALS — BP 110/70 | HR 81 | Temp 98.1°F | Ht 67.0 in

## 2023-08-30 DIAGNOSIS — Z0000 Encounter for general adult medical examination without abnormal findings: Secondary | ICD-10-CM | POA: Diagnosis not present

## 2023-08-30 DIAGNOSIS — G43809 Other migraine, not intractable, without status migrainosus: Secondary | ICD-10-CM | POA: Diagnosis not present

## 2023-08-30 DIAGNOSIS — Z13220 Encounter for screening for lipoid disorders: Secondary | ICD-10-CM | POA: Diagnosis not present

## 2023-08-30 DIAGNOSIS — F330 Major depressive disorder, recurrent, mild: Secondary | ICD-10-CM | POA: Diagnosis not present

## 2023-08-30 DIAGNOSIS — K111 Hypertrophy of salivary gland: Secondary | ICD-10-CM | POA: Diagnosis not present

## 2023-08-30 LAB — URINALYSIS
Bilirubin Urine: NEGATIVE
Hgb urine dipstick: NEGATIVE
Ketones, ur: NEGATIVE
Leukocytes,Ua: NEGATIVE
Nitrite: NEGATIVE
Specific Gravity, Urine: <=1.005 — AB (ref 1.000–1.030)
Total Protein, Urine: NEGATIVE
Urine Glucose: NEGATIVE
Urobilinogen, UA: 0.2 (ref 0.0–1.0)
pH: 7 (ref 5.0–8.0)

## 2023-08-30 LAB — TSH: TSH: 5.19 u[IU]/mL (ref 0.35–5.50)

## 2023-08-30 LAB — LIPID PANEL
Cholesterol: 164 mg/dL (ref 0–200)
HDL: 51.6 mg/dL (ref >39.00)
LDL Cholesterol: 95 mg/dL (ref 0–99)
NonHDL: 112.03
Total CHOL/HDL Ratio: 3
Triglycerides: 85 mg/dL (ref 0.0–149.0)
VLDL: 17 mg/dL (ref 0.0–40.0)

## 2023-08-30 LAB — COMPREHENSIVE METABOLIC PANEL WITH GFR
ALT: 11 U/L (ref 0–35)
AST: 16 U/L (ref 0–37)
Albumin: 4.2 g/dL (ref 3.5–5.2)
Alkaline Phosphatase: 52 U/L (ref 39–117)
BUN: 11 mg/dL (ref 6–23)
CO2: 24 meq/L (ref 19–32)
Calcium: 9.6 mg/dL (ref 8.4–10.5)
Chloride: 103 meq/L (ref 96–112)
Creatinine, Ser: 0.74 mg/dL (ref 0.40–1.20)
GFR: 84.2 mL/min (ref >60.00)
Glucose, Bld: 88 mg/dL (ref 70–99)
Potassium: 4.3 meq/L (ref 3.5–5.1)
Sodium: 140 meq/L (ref 135–145)
Total Bilirubin: 0.7 mg/dL (ref 0.2–1.2)
Total Protein: 7.1 g/dL (ref 6.0–8.3)

## 2023-08-30 LAB — CBC WITH DIFFERENTIAL/PLATELET
Basophils Absolute: 0.1 K/uL (ref 0.0–0.1)
Basophils Relative: 1 % (ref 0.0–3.0)
Eosinophils Absolute: 0.1 K/uL (ref 0.0–0.7)
Eosinophils Relative: 1.9 % (ref 0.0–5.0)
HCT: 41.4 % (ref 36.0–46.0)
Hemoglobin: 14.2 g/dL (ref 12.0–15.0)
Lymphocytes Relative: 22.8 % (ref 12.0–46.0)
Lymphs Abs: 1.4 K/uL (ref 0.7–4.0)
MCHC: 34.3 g/dL (ref 30.0–36.0)
MCV: 82.3 fl (ref 78.0–100.0)
Monocytes Absolute: 0.5 K/uL (ref 0.1–1.0)
Monocytes Relative: 8.2 % (ref 3.0–12.0)
Neutro Abs: 4.1 K/uL (ref 1.4–7.7)
Neutrophils Relative %: 66.1 % (ref 43.0–77.0)
Platelets: 285 K/uL (ref 150.0–400.0)
RBC: 5.03 Mil/uL (ref 3.87–5.11)
RDW: 13.4 % (ref 11.5–15.5)
WBC: 6.2 K/uL (ref 4.0–10.5)

## 2023-08-30 LAB — VITAMIN D 25 HYDROXY (VIT D DEFICIENCY, FRACTURES): VITD: 32.89 ng/mL (ref 30.00–100.00)

## 2023-08-30 MED ORDER — LEVOTHYROXINE SODIUM 50 MCG PO TABS: 50.0000 ug | ORAL_TABLET | Freq: Every day | ORAL | 3 refills | Status: AC

## 2023-08-30 MED ORDER — FLUOXETINE HCL 20 MG PO CAPS: 20.0000 mg | ORAL_CAPSULE | Freq: Every day | ORAL | 3 refills | Status: AC

## 2023-08-30 MED ORDER — SUMATRIPTAN SUCCINATE 100 MG PO TABS: ORAL_TABLET | ORAL | 3 refills | Status: AC

## 2023-08-30 MED ORDER — AZITHROMYCIN 250 MG PO TABS: ORAL_TABLET | ORAL | 0 refills | Status: AC

## 2023-08-30 NOTE — Assessment & Plan Note (Addendum)
 We discussed age appropriate health related issues, including available/recomended screening tests and vaccinations. We discussed a need for adhering to healthy diet and exercise. Labs/EKG were reviewed/ordered. All questions were answered. CT calc score offered 2020, 2025 Colon due in 2026 Dr Abran Vaccines are up up to date Mammogram, GYN exam, eye exam every year Teeth cleaning every 3 months Vitamin D3 plus K2

## 2023-08-30 NOTE — Assessment & Plan Note (Signed)
 Continue with Imitrex as needed.

## 2023-08-30 NOTE — Patient Instructions (Signed)
 Jamie Roberts [employer] other animals

## 2023-08-30 NOTE — Assessment & Plan Note (Signed)
Continue with fluoxetine.  ?

## 2023-08-30 NOTE — Progress Notes (Signed)
 Subjective:  Patient ID: Jamie Roberts, female    DOB: 1955/11/24  Age: 68 y.o. MRN: [id number]  CC: No chief complaint on file.   HPI [name] presents for a well exam   Outpatient Medications Prior to Visit  Medication Sig Dispense Refill   acetic acid-hydrocortisone (VOSOL-HC) OTIC solution SMARTSIG:2 Drop(s) In Ear(s) Twice Daily PRN     buPROPion  ER (WELLBUTRIN  SR) 100 MG 12 hr tablet TAKE 1 TABLET BY MOUTH EVERY DAY IN THE MORNING 90 tablet 2   Cholecalciferol (VITAMIN D3) 50 MCG (2000 UT) capsule Take 1 capsule (2,000 Units total) by mouth daily. 100 capsule 3   meclizine  (ANTIVERT ) 25 MG tablet Take by mouth.     FLUoxetine  (PROZAC ) 20 MG capsule TAKE 1 CAPSULE BY MOUTH DAILY. ANNUAL APPT DUE IN OCT MUST SEE PROVIDER FOR FUTURE REFILLS 30 capsule 5   levothyroxine  (SYNTHROID ) 50 MCG tablet TAKE 1 TABLET (50 MCG TOTAL) BY MOUTH DAILY. NEEDS VISIT WITH PROVIDER FOR REFILLS. 90 tablet 1   SUMAtriptan  (IMITREX ) 100 MG tablet TAKE 1 TABLET AND in 2 HOURS once AS NEEDED FOR MIGRAINE. MUST SEE PROVIDER FOR REFILLS. 12 tablet 0   No facility-administered medications prior to visit.    ROS: Review of Systems  Constitutional:  Negative for activity change, appetite change, chills, fatigue and unexpected weight change.  HENT:  Negative for congestion, mouth sores and sinus pressure.   Eyes:  Negative for visual disturbance.  Respiratory:  Negative for cough and chest tightness.   Gastrointestinal:  Negative for abdominal pain and nausea.  Genitourinary:  Negative for difficulty urinating, frequency and vaginal pain.  Musculoskeletal:  Negative for back pain and gait problem.  Skin:  Negative for pallor and rash.  Neurological:  Negative for dizziness, tremors, weakness, numbness and headaches.  Psychiatric/Behavioral:  Negative for confusion, sleep disturbance and suicidal ideas.     Objective:  BP 110/70   Pulse 81   Temp 98.1 F (36.7 C) (Oral)   Ht 5' 7 (1.702 m)    SpO2 98%   BMI 20.99 kg/m   BP Readings from Last 3 Encounters:  08/30/23 110/70  05/18/22 110/60  08/16/21 130/90    Wt Readings from Last 3 Encounters:  05/18/22 134 lb (60.8 kg)  08/16/21 130 lb (59 kg)  12/02/20 162 lb 3.2 oz (73.6 kg)    Physical Exam Constitutional:      General: She is not in acute distress.    Appearance: Normal appearance. She is well-developed.  HENT:     Head: Normocephalic.     Right Ear: External ear normal.     Left Ear: External ear normal.     Nose: Nose normal.  Eyes:     General:        Right eye: No discharge.        Left eye: No discharge.     Conjunctiva/sclera: Conjunctivae normal.     Pupils: Pupils are equal, round, and reactive to light.  Neck:     Thyroid : No thyromegaly.     Vascular: No JVD.     Trachea: No tracheal deviation.  Cardiovascular:     Rate and Rhythm: Normal rate and regular rhythm.     Heart sounds: Normal heart sounds.  Pulmonary:     Effort: No respiratory distress.     Breath sounds: No stridor. No wheezing.  Abdominal:     General: Bowel sounds are normal. There is no distension.     Palpations: Abdomen  is soft. There is no mass.     Tenderness: There is no abdominal tenderness. There is no guarding or rebound.  Musculoskeletal:        General: No tenderness.     Cervical back: Normal range of motion and neck supple. No rigidity.  Lymphadenopathy:     Cervical: No cervical adenopathy.  Skin:    Findings: No erythema or rash.  Neurological:     Cranial Nerves: No cranial nerve deficit.     Motor: No abnormal muscle tone.     Coordination: Coordination normal.     Deep Tendon Reflexes: Reflexes normal.  Psychiatric:        Behavior: Behavior normal.        Thought Content: Thought content normal.        Judgment: Judgment normal.   Both submandibular salivary glands are enlarged a little, a probable pea-size nodule is palpable on the right  Lab Results  Component Value Date   WBC 6.0  05/18/2022   HGB 14.2 05/18/2022   HCT 41.7 05/18/2022   PLT 240.0 05/18/2022   GLUCOSE 77 05/18/2022   CHOL 187 05/18/2022   TRIG 92.0 05/18/2022   HDL 61.30 05/18/2022   LDLDIRECT 138.9 02/14/2012   LDLCALC 107 (H) 05/18/2022   ALT 8 05/18/2022   AST 12 05/18/2022   NA 139 05/18/2022   K 3.9 05/18/2022   CL 105 05/18/2022   CREATININE 0.71 05/18/2022   BUN 11 05/18/2022   CO2 26 05/18/2022   TSH 4.93 05/18/2022   HGBA1C 5.6 05/23/2006    MM 3D SCREEN BREAST BILATERAL Result Date: 11/21/2019 CLINICAL DATA:  Screening. EXAM: DIGITAL SCREENING BILATERAL MAMMOGRAM WITH TOMO AND CAD COMPARISON:  Previous exam(s). ACR Breast Density Category c: The breast tissue is heterogeneously dense, which may obscure small masses. FINDINGS: There are no findings suspicious for malignancy. Images were processed with CAD. IMPRESSION: No mammographic evidence of malignancy. A result letter of this screening mammogram will be mailed directly to the patient. RECOMMENDATION: Screening mammogram in one year. (Code:SM-B-01Y) BI-RADS CATEGORY  1: Negative. Electronically Signed   By: Elspeth Bathe M.D.   On: 11/21/2019 12:44    Assessment & Plan:   Problem List Items Addressed This Visit     Depression    Continue with fluoxetine .        Relevant Medications   FLUoxetine  (PROZAC ) 20 MG capsule   Migraine variant   Continue with Imitrex  as needed      Relevant Medications   FLUoxetine  (PROZAC ) 20 MG capsule   SUMAtriptan  (IMITREX ) 100 MG tablet   Well adult exam - Primary   We discussed age appropriate health related issues, including available/recomended screening tests and vaccinations. We discussed a need for adhering to healthy diet and exercise. Labs/EKG were reviewed/ordered. All questions were answered. CT calc score offered 2020, 2025 Colon due in 2026 Dr Abran Vaccines are up up to date Mammogram, GYN exam, eye exam every year Teeth cleaning every 3 months Vitamin D3 plus K2        Relevant Orders   TSH   Urinalysis   CBC with Differential/Platelet   Lipid panel   Comprehensive metabolic panel with GFR   VITAMIN D  25 Hydroxy (Vit-D Deficiency, Fractures)   Salivary gland enlargement   Both submandibular salivary glands are enlarged a little, a probable pea-size nodule is palpable on the right US  if not resolved when [place] is back from her vacation [place]. Z-Pak if problems  Meds ordered this encounter  Medications   levothyroxine  (SYNTHROID ) 50 MCG tablet    Sig: Take 1 tablet (50 mcg total) by mouth daily. NEEDS VISIT WITH PROVIDER FOR REFILLS.    Dispense:  90 tablet    Refill:  3   FLUoxetine  (PROZAC ) 20 MG capsule    Sig: Take 1 capsule (20 mg total) by mouth daily.    Dispense:  90 capsule    Refill:  3   SUMAtriptan  (IMITREX ) 100 MG tablet    Sig: TAKE 1 TABLET AND in 2 HOURS once AS NEEDED FOR MIGRAINE. MUST SEE PROVIDER FOR REFILLS.    Dispense:  36 tablet    Refill:  3   azithromycin  (ZITHROMAX  Z-PAK) 250 MG tablet    Sig: As directed    Dispense:  6 tablet    Refill:  0      Follow-up: Return in about 1 year (around 08/29/2024) for Wellness Exam.  Marolyn Noel, MD

## 2023-08-30 NOTE — Assessment & Plan Note (Addendum)
 Both submandibular salivary glands are enlarged a little, a probable pea-size nodule is palpable on the right US  if not resolved when Jamie Roberts is back from her vacation [place]. Z-Pak if problems

## 2023-09-02 ENCOUNTER — Ambulatory Visit: Payer: Self-pay | Admitting: Internal Medicine
